# Patient Record
Sex: Female | Born: 2000 | Hispanic: Yes | Marital: Single | Smoking: Never smoker
Health system: Southern US, Community
[De-identification: ages and names within clinical notes are randomized; demographics above are authoritative.]

## PROBLEM LIST (undated history)

## (undated) DIAGNOSIS — B009 Herpesviral infection, unspecified: Secondary | ICD-10-CM

## (undated) HISTORY — DX: Herpesviral infection, unspecified: B00.9

---
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
## 2019-04-07 ENCOUNTER — Ambulatory Visit: Payer: Self-pay

## 2020-01-30 LAB — OB RESULTS CONSOLE HEPATITIS B SURFACE ANTIGEN: Hepatitis B Surface Ag: NEGATIVE

## 2020-01-30 LAB — OB RESULTS CONSOLE VARICELLA ZOSTER ANTIBODY, IGG: Varicella: IMMUNE

## 2020-01-30 LAB — OB RESULTS CONSOLE RUBELLA ANTIBODY, IGM: Rubella: IMMUNE

## 2020-02-02 ENCOUNTER — Other Ambulatory Visit: Payer: Self-pay | Admitting: Family Medicine

## 2020-02-02 ENCOUNTER — Other Ambulatory Visit (HOSPITAL_COMMUNITY): Payer: Self-pay | Admitting: Family Medicine

## 2020-02-02 DIAGNOSIS — Z3481 Encounter for supervision of other normal pregnancy, first trimester: Secondary | ICD-10-CM

## 2020-02-02 LAB — OB RESULTS CONSOLE GC/CHLAMYDIA
Chlamydia: NEGATIVE
Gonorrhea: NEGATIVE

## 2020-02-16 ENCOUNTER — Ambulatory Visit
Admission: RE | Admit: 2020-02-16 | Discharge: 2020-02-16 | Disposition: A | Discharge status: Home / Self Care | Payer: Medicaid Other | Source: Ambulatory Visit | Attending: Family Medicine | Admitting: Family Medicine

## 2020-02-16 ENCOUNTER — Other Ambulatory Visit: Payer: Self-pay | Admitting: Family Medicine

## 2020-02-16 ENCOUNTER — Other Ambulatory Visit: Payer: Self-pay

## 2020-02-16 DIAGNOSIS — Z3481 Encounter for supervision of other normal pregnancy, first trimester: Secondary | ICD-10-CM

## 2020-02-21 NOTE — L&D Delivery Note (Signed)
Delivery Note  Sarah Martinez is a [id number] at [redacted]w[redacted]d with an LMP of 11/14/2019, consistent with [place] at [redacted]w[redacted]d.   First Stage: Martinez onset: 2000 Augmentation: AROM Analgesia /Anesthesia intrapartum: Epidural AROM at 0753  Second Stage: Complete dilation at 0630 Onset of pushing at 1012 FHR second stage 145bpm with moderate variability, variables with pushign   Sarah Martinez.  She was expectantly mananged and progressed well to C/C/-1.  AROM performed to help with decent of fetal head.  Sarah Martinez did not have any urge to push.  Epidural rate was decreased and variety of positions were used to help with fetal descent during passive 2nd stage.  She reported an urge to push and pushed effectively over approximately 10 minutes for a spontaneous birth.   Delivery of a viable baby Sarah on 08/15/2020 at 10/22 by CNM Delivery of fetal head in LOA position with restitution to LOT. No nuchal cord;  Anterior then posterior shoulders delivered easily with gentle downward traction. Baby placed on mom's chest, and attended to by baby RN Cord double clamped after cessation of pulsation, cut by grandmother of baby   Cord blood sample collected: Yes - O pos   Third Stage: Oxytocin bolus started after delivery of infant for hemorrhage prophylaxis  Placenta delivered intact with 3 VC @ 1034 Placenta disposition: sent with patient  Uterine tone firm / bleeding initially brisk - > moderate to small after interventions   Brisk bleeding noted after delivery of placenta.  Uterus firm with massage.  Methergine 0.2 mg IM given in addition to oxytocin bolus.  Fundus remained firm, bleeding improved to moderate to small   1st vaginal laceration identified  Anesthesia for repair: epidural Repair with 2-0 vicryl CT in usual fashion  Est. Blood Loss (mL): 494 ml  Complications: None   Mom to postpartum.  Baby to Couplet care / Skin to Skin.  Newborn: Information for the patient's  newborn:  Sarah Martinez, Sarah Martinez [[id number]]  Live born female  Birth Weight: 8 lb 13.8 oz (4020 g) APGAR: 8, 9  Newborn Delivery   Birth date/time: 08/15/2020 10:22:00 Delivery type: Vaginal, Spontaneous     Feeding planned: breast   ---------- Margaretmary Eddy, CNM Certified Nurse Midwife [place]  [facility]

## 2020-03-02 ENCOUNTER — Other Ambulatory Visit: Payer: Self-pay | Admitting: Family Medicine

## 2020-03-02 DIAGNOSIS — Z3480 Encounter for supervision of other normal pregnancy, unspecified trimester: Secondary | ICD-10-CM

## 2020-03-22 ENCOUNTER — Ambulatory Visit
Admission: RE | Admit: 2020-03-22 | Discharge: 2020-03-22 | Disposition: A | Discharge status: Home / Self Care | Payer: Medicaid Other | Source: Ambulatory Visit | Attending: Family Medicine | Admitting: Family Medicine

## 2020-03-22 ENCOUNTER — Other Ambulatory Visit: Payer: Self-pay

## 2020-03-22 DIAGNOSIS — Z3480 Encounter for supervision of other normal pregnancy, unspecified trimester: Secondary | ICD-10-CM | POA: Insufficient documentation

## 2020-04-27 ENCOUNTER — Other Ambulatory Visit: Payer: Self-pay | Admitting: Family Medicine

## 2020-04-27 DIAGNOSIS — Z3480 Encounter for supervision of other normal pregnancy, unspecified trimester: Secondary | ICD-10-CM

## 2020-05-12 ENCOUNTER — Other Ambulatory Visit: Payer: Self-pay

## 2020-05-12 ENCOUNTER — Ambulatory Visit
Admission: RE | Admit: 2020-05-12 | Discharge: 2020-05-12 | Disposition: A | Discharge status: Home / Self Care | Payer: Medicaid Other | Source: Ambulatory Visit | Attending: Family Medicine | Admitting: Family Medicine

## 2020-05-12 DIAGNOSIS — Z3480 Encounter for supervision of other normal pregnancy, unspecified trimester: Secondary | ICD-10-CM | POA: Diagnosis present

## 2020-05-26 LAB — OB RESULTS CONSOLE RPR: RPR: NONREACTIVE

## 2020-05-26 LAB — OB RESULTS CONSOLE HIV ANTIBODY (ROUTINE TESTING): HIV: NONREACTIVE

## 2020-07-21 LAB — OB RESULTS CONSOLE GBS: GBS: NEGATIVE

## 2020-08-15 ENCOUNTER — Inpatient Hospital Stay
Admission: EM | Admit: 2020-08-15 | Discharge: 2020-08-16 | DRG: 807 | Disposition: A | Discharge status: Home / Self Care | Payer: Medicaid Other

## 2020-08-15 ENCOUNTER — Encounter: Payer: Self-pay | Admitting: Emergency Medicine

## 2020-08-15 ENCOUNTER — Inpatient Hospital Stay: Payer: Medicaid Other | Admitting: Anesthesiology

## 2020-08-15 ENCOUNTER — Other Ambulatory Visit: Payer: Self-pay

## 2020-08-15 DIAGNOSIS — O9832 Other infections with a predominantly sexual mode of transmission complicating childbirth: Principal | ICD-10-CM | POA: Diagnosis present

## 2020-08-15 DIAGNOSIS — O99214 Obesity complicating childbirth: Secondary | ICD-10-CM | POA: Diagnosis present

## 2020-08-15 DIAGNOSIS — Z20822 Contact with and (suspected) exposure to covid-19: Secondary | ICD-10-CM | POA: Diagnosis present

## 2020-08-15 DIAGNOSIS — A6000 Herpesviral infection of urogenital system, unspecified: Secondary | ICD-10-CM | POA: Diagnosis present

## 2020-08-15 DIAGNOSIS — Z3A39 39 weeks gestation of pregnancy: Secondary | ICD-10-CM

## 2020-08-15 DIAGNOSIS — O26893 Other specified pregnancy related conditions, third trimester: Secondary | ICD-10-CM | POA: Diagnosis present

## 2020-08-15 LAB — TYPE AND SCREEN
ABO/RH(D): O POS
Antibody Screen: NEGATIVE

## 2020-08-15 LAB — RPR: RPR Ser Ql: NONREACTIVE

## 2020-08-15 LAB — CBC
HCT: 36.8 % (ref 36.0–46.0)
Hemoglobin: 12.9 g/dL (ref 12.0–15.0)
MCH: 31.1 pg (ref 26.0–34.0)
MCHC: 35.1 g/dL (ref 30.0–36.0)
MCV: 88.7 fL (ref 80.0–100.0)
Platelets: 201 10*3/uL (ref 150–400)
RBC: 4.15 MIL/uL (ref 3.87–5.11)
RDW: 13.7 % (ref 11.5–15.5)
WBC: 8.8 10*3/uL (ref 4.0–10.5)
nRBC: 0 % (ref 0.0–0.2)

## 2020-08-15 LAB — RESP PANEL BY RT-PCR (FLU A&B, COVID) ARPGX2
Influenza A by PCR: NEGATIVE
Influenza B by PCR: NEGATIVE
SARS Coronavirus 2 by RT PCR: NEGATIVE

## 2020-08-15 MED ORDER — SODIUM CHLORIDE 0.9 % IV SOLN
INTRAVENOUS | Status: DC | PRN
  Administered 2020-08-15: 12 mL/h via EPIDURAL

## 2020-08-15 MED ORDER — DIPHENHYDRAMINE HCL 50 MG/ML IJ SOLN: 12.5000 mg | INTRAMUSCULAR | Status: DC | PRN

## 2020-08-15 MED ORDER — FENTANYL CITRATE (PF) 100 MCG/2ML IJ SOLN: 50.0000 ug | INTRAMUSCULAR | Status: DC | PRN

## 2020-08-15 MED ORDER — PRENATAL MULTIVITAMIN CH: 1.0000 | ORAL_TABLET | Freq: Every day | ORAL | Status: DC

## 2020-08-15 MED ORDER — ACETAMINOPHEN 500 MG PO TABS
1000.0000 mg | ORAL_TABLET | Freq: Four times a day (QID) | ORAL | Status: DC | PRN
  Administered 2020-08-15: 1000 mg via ORAL
  Filled 2020-08-15: qty 2

## 2020-08-15 MED ORDER — IBUPROFEN 600 MG PO TABS
600.0000 mg | ORAL_TABLET | Freq: Four times a day (QID) | ORAL | Status: DC
  Administered 2020-08-15 – 2020-08-16 (×3): 600 mg via ORAL
  Filled 2020-08-15 (×4): qty 1

## 2020-08-15 MED ORDER — PHENYLEPHRINE 40 MCG/ML (10ML) SYRINGE FOR IV PUSH (FOR BLOOD PRESSURE SUPPORT): 80.0000 ug | PREFILLED_SYRINGE | INTRAVENOUS | Status: DC | PRN

## 2020-08-15 MED ORDER — MISOPROSTOL 200 MCG PO TABS
ORAL_TABLET | ORAL | Status: AC
  Filled 2020-08-15: qty 4

## 2020-08-15 MED ORDER — OXYTOCIN-SODIUM CHLORIDE 30-0.9 UT/500ML-% IV SOLN
INTRAVENOUS | Status: AC
  Administered 2020-08-15: 333 mL via INTRAVENOUS
  Filled 2020-08-15: qty 500

## 2020-08-15 MED ORDER — BUPIVACAINE HCL (PF) 0.25 % IJ SOLN
INTRAMUSCULAR | Status: DC | PRN
  Administered 2020-08-15: 8 mL via EPIDURAL

## 2020-08-15 MED ORDER — ACETAMINOPHEN 500 MG PO TABS: 1000.0000 mg | ORAL_TABLET | Freq: Four times a day (QID) | ORAL | Status: DC | PRN

## 2020-08-15 MED ORDER — WITCH HAZEL-GLYCERIN EX PADS: 1.0000 "application " | MEDICATED_PAD | CUTANEOUS | Status: DC

## 2020-08-15 MED ORDER — COCONUT OIL OIL
1.0000 "application " | TOPICAL_OIL | Status: DC | PRN
  Administered 2020-08-16: 1 via TOPICAL
  Filled 2020-08-15 (×2): qty 120

## 2020-08-15 MED ORDER — LIDOCAINE HCL (PF) 1 % IJ SOLN
INTRAMUSCULAR | Status: DC | PRN
  Administered 2020-08-15: 3 mL

## 2020-08-15 MED ORDER — DOCUSATE SODIUM 100 MG PO CAPS
100.0000 mg | ORAL_CAPSULE | Freq: Two times a day (BID) | ORAL | Status: DC
  Administered 2020-08-15 – 2020-08-16 (×2): 100 mg via ORAL
  Filled 2020-08-15 (×2): qty 1

## 2020-08-15 MED ORDER — FENTANYL-BUPIVACAINE-NACL 0.5-0.125-0.9 MG/250ML-% EP SOLN: 12.0000 mL/h | EPIDURAL | Status: DC | PRN

## 2020-08-15 MED ORDER — VARICELLA VIRUS VACCINE LIVE 1350 PFU/0.5ML IJ SUSR
0.5000 mL | INTRAMUSCULAR | Status: DC | PRN
  Filled 2020-08-15: qty 0.5

## 2020-08-15 MED ORDER — BENZOCAINE-MENTHOL 20-0.5 % EX AERO: 1.0000 "application " | INHALATION_SPRAY | CUTANEOUS | Status: DC | PRN

## 2020-08-15 MED ORDER — OXYTOCIN-SODIUM CHLORIDE 30-0.9 UT/500ML-% IV SOLN
2.5000 [IU]/h | INTRAVENOUS | Status: DC
Start: 2020-08-15 — End: 2020-08-16

## 2020-08-15 MED ORDER — FENTANYL 2.5 MCG/ML W/ROPIVACAINE 0.15% IN NS 100 ML EPIDURAL (ARMC)
EPIDURAL | Status: AC
  Filled 2020-08-15: qty 100

## 2020-08-15 MED ORDER — DIPHENHYDRAMINE HCL 25 MG PO CAPS: 25.0000 mg | ORAL_CAPSULE | Freq: Four times a day (QID) | ORAL | Status: DC | PRN

## 2020-08-15 MED ORDER — LIDOCAINE-EPINEPHRINE (PF) 1.5 %-1:200000 IJ SOLN
INTRAMUSCULAR | Status: DC | PRN
  Administered 2020-08-15: 3 mL via PERINEURAL

## 2020-08-15 MED ORDER — LACTATED RINGERS IV SOLN: INTRAVENOUS | Status: DC

## 2020-08-15 MED ORDER — AMMONIA AROMATIC IN INHA
RESPIRATORY_TRACT | Status: AC
  Filled 2020-08-15: qty 10

## 2020-08-15 MED ORDER — OXYTOCIN BOLUS FROM INFUSION: 333.0000 mL | Freq: Once | INTRAVENOUS | Status: AC

## 2020-08-15 MED ORDER — METHYLERGONOVINE MALEATE 0.2 MG/ML IJ SOLN
INTRAMUSCULAR | Status: AC
  Administered 2020-08-15: 0.2 mg via INTRAMUSCULAR
  Filled 2020-08-15: qty 1

## 2020-08-15 MED ORDER — LACTATED RINGERS IV SOLN
500.0000 mL | Freq: Once | INTRAVENOUS | Status: AC
  Administered 2020-08-15: 500 mL via INTRAVENOUS

## 2020-08-15 MED ORDER — LACTATED RINGERS IV SOLN
500.0000 mL | INTRAVENOUS | Status: DC | PRN
  Administered 2020-08-15: 08:00:00 500 mL via INTRAVENOUS

## 2020-08-15 MED ORDER — SIMETHICONE 80 MG PO CHEW: 80.0000 mg | CHEWABLE_TABLET | ORAL | Status: DC | PRN

## 2020-08-15 MED ORDER — LIDOCAINE HCL (PF) 1 % IJ SOLN
INTRAMUSCULAR | Status: AC
  Filled 2020-08-15: qty 30

## 2020-08-15 MED ORDER — DIBUCAINE (PERIANAL) 1 % EX OINT: 1.0000 "application " | TOPICAL_OINTMENT | CUTANEOUS | Status: DC | PRN

## 2020-08-15 MED ORDER — ONDANSETRON HCL 4 MG/2ML IJ SOLN
4.0000 mg | INTRAMUSCULAR | Status: DC | PRN
  Administered 2020-08-15: 4 mg via INTRAVENOUS
  Filled 2020-08-15: qty 2

## 2020-08-15 MED ORDER — ONDANSETRON HCL 4 MG PO TABS
4.0000 mg | ORAL_TABLET | ORAL | Status: DC | PRN
  Filled 2020-08-15: qty 1

## 2020-08-15 MED ORDER — OXYTOCIN 10 UNIT/ML IJ SOLN
INTRAMUSCULAR | Status: AC
  Filled 2020-08-15: qty 2

## 2020-08-15 MED ORDER — SODIUM CHLORIDE 0.9 % IV SOLN: 2.0000 g | Freq: Once | INTRAVENOUS | Status: DC

## 2020-08-15 MED ORDER — EPHEDRINE 5 MG/ML INJ: 10.0000 mg | INTRAVENOUS | Status: DC | PRN

## 2020-08-15 MED ORDER — SOD CITRATE-CITRIC ACID 500-334 MG/5ML PO SOLN: 30.0000 mL | ORAL | Status: DC | PRN

## 2020-08-15 MED ORDER — ONDANSETRON HCL 4 MG/2ML IJ SOLN
4.0000 mg | Freq: Four times a day (QID) | INTRAMUSCULAR | Status: DC | PRN
  Administered 2020-08-15: 03:00:00 4 mg via INTRAVENOUS
  Filled 2020-08-15: qty 2

## 2020-08-15 MED ORDER — LIDOCAINE HCL (PF) 1 % IJ SOLN: 30.0000 mL | INTRAMUSCULAR | Status: DC | PRN

## 2020-08-15 MED ORDER — CALCIUM CARBONATE ANTACID 500 MG PO CHEW
2.0000 | CHEWABLE_TABLET | Freq: Four times a day (QID) | ORAL | Status: DC | PRN
  Administered 2020-08-15: 400 mg via ORAL
  Filled 2020-08-15: qty 2

## 2020-08-15 NOTE — Discharge Instructions (Addendum)
Discharge instructions:  Call office if you have any of the following: fever >101 F, chills, excessive vaginal bleeding, incision drainage or problems, leg pain or redness, or any other concerns.    Activity: Do not lift > 10 lbs for 8 weeks.  No intercourse or tampons for 8 weeks.  No driving for 1-2 weeks.    Please don't limit yourself in terms of routine activity.  You will be able to do most things, although they may take longer to do or be a little painful.  You can do it!   Don't be a hero, but don't be a wimp either!  Remember you should be getting better every day.  Please remember to walk around your home several times a day.    Vaginal Delivery, Care After Refer to this sheet in the next few weeks. These discharge instructions provide you with information on caring for yourself after delivery. Your caregiver may also give you specific instructions. Your treatment has been planned according to the most current medical practices available, but problems sometimes occur. Call your caregiver if you have any problems or questions after you go home. HOME CARE INSTRUCTIONS Take over-the-counter or prescription medicines only as directed by your caregiver or pharmacist. Do not drink alcohol, especially if you are breastfeeding or taking medicine to relieve pain. Do not smoke tobacco. Continue to use good perineal care. Good perineal care includes: Wiping your perineum from back to front Keeping your perineum clean. You can do sitz baths twice a day, to help keep this area clean Do not use tampons, douche or have sex until your caregiver says it is okay. Shower only and avoid sitting in submerged water, aside from sitz baths Wear a well-fitting bra that provides breast support. Eat healthy foods. Drink enough fluids to keep your urine clear or pale yellow. Eat high-fiber foods such as whole grain cereals and breads, brown rice, beans, and fresh fruits and vegetables every day. These foods  may help prevent or relieve constipation. Avoid constipation with high fiber foods or medications, such as miralax or metamucil Follow your caregiver's recommendations regarding resumption of activities such as climbing stairs, driving, lifting, exercising, or traveling. Talk to your caregiver about resuming sexual activities. Resumption of sexual activities is dependent upon your risk of infection, your rate of healing, and your comfort and desire to resume sexual activity. Try to have someone help you with your household activities and your newborn for at least a few days after you leave the hospital. Rest as much as possible. Try to rest or take a nap when your newborn is sleeping. Increase your activities gradually. Keep all of your scheduled postpartum appointments. It is very important to keep your scheduled follow-up appointments. At these appointments, your caregiver will be checking to make sure that you are healing physically and emotionally. SEEK MEDICAL CARE IF:  You are passing large clots from your vagina. Save any clots to show your caregiver. You have a foul smelling discharge from your vagina. You have trouble urinating. You are urinating frequently. You have pain when you urinate. You have a change in your bowel movements. You have increasing redness, pain, or swelling near your vaginal incision (episiotomy) or vaginal tear. You have pus draining from your episiotomy or vaginal tear. Your episiotomy or vaginal tear is separating. You have painful, hard, or reddened breasts. You have a severe headache. You have blurred vision or see spots. You feel sad or depressed. You have thoughts of hurting yourself  or your newborn. You have questions about your care, the care of your newborn, or medicines. You are dizzy or light-headed. You have a rash. You have nausea or vomiting. You were breastfeeding and have not had a menstrual period within 12 weeks after you stopped  breastfeeding. You are not breastfeeding and have not had a menstrual period by the 12th week after delivery. You have a fever. SEEK IMMEDIATE MEDICAL CARE IF:  You have persistent pain. You have chest pain. You have shortness of breath. You faint. You have leg pain. You have stomach pain. Your vaginal bleeding saturates two or more sanitary pads in 1 hour. MAKE SURE YOU:  Understand these instructions. Will watch your condition. Will get help right away if you are not doing well or get worse. Document Released: 02/04/2000 Document Revised: 06/23/2013 Document Reviewed: 10/04/2011 [facility] Patient Information 2015 [place], [place]. This information is not intended to replace advice given to you by your health care provider. Make sure you discuss any questions you have with your health care provider.  Sitz Bath A sitz bath is a warm water bath taken in the sitting position. The water covers only the hips and butt (buttocks). We recommend using one that fits in the toilet, to help with ease of use and cleanliness. It may be used for either healing or cleaning purposes. Sitz baths are also used to relieve pain, itching, or muscle tightening (spasms). The water may contain medicine. Moist heat will help you heal and relax.  HOME CARE  Take 3 to 4 sitz baths a day. Fill the bathtub half-full with warm water. Sit in the water and open the drain a little. Turn on the warm water to keep the tub half-full. Keep the water running constantly. Soak in the water for 15 to 20 minutes. After the sitz bath, pat the affected area dry. GET HELP RIGHT AWAY IF: You get worse instead of better. Stop the sitz baths if you get worse. MAKE SURE YOU: Understand these instructions. Will watch your condition. Will get help right away if you are not doing well or get worse. Document Released: 03/16/2004 Document Revised: 11/01/2011 Document Reviewed: 06/06/2010 [facility] Patient Information 2015 [place], [place].  This information is not intended to replace advice given to you by your health care provider. Make sure you discuss any questions you have with your health care provider.

## 2020-08-15 NOTE — Anesthesia Preprocedure Evaluation (Signed)
Anesthesia Evaluation  Patient identified by MRN, date of birth, ID band Patient awake    Reviewed: Allergy & Precautions, NPO status , Patient's Chart, lab work & pertinent test results  Airway Mallampati: II  TM Distance: >3 FB Neck ROM: Full    Dental no notable dental hx.    Pulmonary neg pulmonary ROS,    Pulmonary exam normal        Cardiovascular negative cardio ROS   Rhythm:Regular Rate:Normal     Neuro/Psych negative neurological ROS  negative psych ROS   GI/Hepatic negative GI ROS, Neg liver ROS,   Endo/Other  negative endocrine ROS  Renal/GU negative Renal ROS  negative genitourinary   Musculoskeletal negative musculoskeletal ROS (+)   Abdominal   Peds negative pediatric ROS (+)  Hematology negative hematology ROS (+)   Anesthesia Other Findings   Reproductive/Obstetrics negative OB ROS (+) Pregnancy                             Anesthesia Physical Anesthesia Plan  ASA: 2  Anesthesia Plan: Epidural   Post-op Pain Management:    Induction:   PONV Risk Score and Plan:   Airway Management Planned:   Additional Equipment:   Intra-op Plan:   Post-operative Plan:   Informed Consent: I have reviewed the patients History and Physical, chart, labs and discussed the procedure including the risks, benefits and alternatives for the proposed anesthesia with the patient or authorized representative who has indicated his/her understanding and acceptance.       Plan Discussed with:   Anesthesia Plan Comments:         Anesthesia Quick Evaluation

## 2020-08-15 NOTE — OB Triage Note (Signed)
Pt is a G3 P1 pt with [facility] and arrives here with c/o ctx's every 2-5. No bleeding or fluid loss at this time.

## 2020-08-15 NOTE — Discharge Summary (Addendum)
Obstetrical Discharge Summary  Patient Name: Sarah Martinez DOB: 28-Mar-2000 MRN: [id number]  Date of Admission: 08/15/2020 Date of Delivery: 08/15/2020 Delivered by: Margaretmary Eddy, CNM  Date of Discharge: 08/16/2020  Primary OB: [facility] FAO:[id number] last menstrual period was 11/14/2019. EDC Estimated Date of Delivery: 08/20/20 Gestational Age at Delivery: [redacted]w[redacted]d   Antepartum complications:  Varicella non-immune Hx of HSV - Taking Acyclovir Obesity in pregnancy   Admitting Diagnosis: active labor  Secondary Diagnosis: Patient Active Problem List   Diagnosis Date Noted   NSVD (normal spontaneous vaginal delivery) 08/16/2020    Augmentation: AROM Complications: None Intrapartum complications/course: Sarah Martinez presented to L&D in active labor.  She was expectantly mananged and progressed well to C/C/-1.  AROM performed to help with decent of fetal head.  Sarah Martinez did not have any urge to push.  Epidural rate was decreased and variety of positions were used to help with fetal descent during passive 2nd stage.  She reported an urge to push and pushed effectively over approximately 10 minutes for a spontaneous birth.   Delivery Type: spontaneous vaginal delivery Anesthesia: epidural Placenta: spontaneous Laceration: 1st vaginal with repair  Episiotomy: none Newborn Data: Live born female  Birth Weight: 8 lb 13.8 oz (4020 g) APGAR: 8, 9  Newborn Delivery   Birth date/time: 08/15/2020 10:22:00 Delivery type: Vaginal, Spontaneous        Postpartum Procedures: None Edinburgh:  Edinburgh Postnatal Depression Scale Screening Tool 08/16/2020 08/15/2020  I have been able to laugh and see the funny side of things. 0 (No Data)  I have looked forward with enjoyment to things. 0 -  I have blamed myself unnecessarily when things went wrong. 1 -  I have been anxious or worried for no good reason. 0 -  I have felt scared or panicky for no good reason. 0 -  Things have been getting on top  of me. 1 -  I have been so unhappy that I have had difficulty sleeping. 0 -  I have felt sad or miserable. 1 -  I have been so unhappy that I have been crying. 0 -  The thought of harming myself has occurred to me. 0 -  Edinburgh Postnatal Depression Scale Total 3 -     Post partum course:  Patient had an uncomplicated postpartum course.  By time of discharge on PPD#1, her pain was controlled on oral pain medications; she had appropriate lochia and was ambulating, voiding without difficulty and tolerating regular diet.  She was deemed stable for discharge to home.    Discharge Physical Exam:  BP 101/70 (BP Location: Right Arm)   Pulse 78   Temp 98.2 F (36.8 C) (Oral)   Resp 18   Ht 5\' 1"  (1.549 m)   Wt 81.6 kg   LMP 11/14/2019   SpO2 98%   Breastfeeding Unknown   BMI 34.01 kg/m   General: NAD CV: RRR Pulm: CTABL, nl effort ABD: s/nd/nt, fundus firm and below the umbilicus Lochia: moderate Perineum:minimal edema/repair well approximated DVT Evaluation: LE non-ttp, no evidence of DVT on exam.  Hemoglobin  Date Value Ref Range Status  08/16/2020 10.2 (L) 12.0 - 15.0 g/dL Final   HGB  Date Value Ref Range Status  09/07/2013 13.2 12.0 - 16.0 g/dL Final   HCT  Date Value Ref Range Status  08/16/2020 28.9 (L) 36.0 - 46.0 % Final  09/07/2013 39.4 35.0 - 47.0 % Final     Disposition: stable, discharge to home. Baby Feeding: breastmilk Baby  Disposition: home with mom  Rh Immune globulin given: Rh pos  Rubella vaccine given: Immune Varivax vaccine given: Non-Immune - Varivax vaccine refused Flu vaccine given in AP or PP setting: declined  Tdap vaccine given in AP or PP setting: given 06/21/2020  Contraception: TBD  Prenatal Labs:  Blood type/Rh O pos   Antibody screen neg  Rubella Immune  Varicella Non-Immune  RPR NR  HBsAg Neg  HIV NR  GC neg  Chlamydia neg  Genetic screening Declined   1 hour GTT 84  3 hour GTT N/A  GBS Negative     Plan:  Sarah Martinez was discharged to home in good condition. Follow-up appointment with delivering provider in 6 weeks.  Discharge Medications: Allergies as of 08/16/2020   No Known Allergies      Medication List     TAKE these medications    famotidine 20 MG tablet Commonly known as: Pepcid Take 1 tablet (20 mg total) by mouth 2 (two) times daily.   prenatal multivitamin Tabs tablet Take 1 tablet by mouth daily at 12 noon.         Follow-up Information     Center, [facility]. Schedule an appointment as soon as possible for a visit in 6 week(s).   Specialty: General Practice Why: postpartum visit Contact information: [address] [place] [address] [phone]                 Signed: Cyril Mourning 08/16/2020 12:30 PM

## 2020-08-15 NOTE — H&P (Signed)
OB History & Physical   History of Present Illness:   Chief Complaint: regular, painful contractions   HPI:  Ravynn Hogate Lynnea Vandervoort is a 20 y.o. G53P0101 female at [redacted]w[redacted]d dated by LMP of 11/14/2019, c/w [place] at [redacted]w[redacted]d.  She presents to L&D for regular, painful contractions.   Reports active fetal movement  Contractions: every 2 to 3 minutes, started at 2000 last night  LOF/SROM: denies  Vaginal bleeding: denies   Pregnancy Issues: Varicella non-immune Hx of HSV - Taking Acyclovir  Obesity in pregnancy   Patient Active Problem List   Diagnosis Date Noted   Normal labor 08/15/2020     Maternal Medical History:  History reviewed. No pertinent past medical history.  History reviewed. No pertinent surgical history.  No Known Allergies  Prior to Admission medications   Medication Sig Start Date End Date Taking? Authorizing Provider  famotidine (PEPCID) 20 MG tablet Take 1 tablet (20 mg total) by mouth 2 (two) times daily. 06/14/15   Emily Filbert, MD  ondansetron (ZOFRAN) 4 MG tablet Take 1 tablet (4 mg total) by mouth daily as needed for nausea or vomiting. 06/14/15   Emily Filbert, MD  Prenatal Vit-Fe Fumarate-FA (PRENATAL MULTIVITAMIN) TABS tablet Take 1 tablet by mouth daily at 12 noon.    [provider]     [facility]  Social History: She  reports that she has never smoked. She has never used smokeless tobacco. She reports that she does not drink alcohol and does not use drugs.  Family History: family history is not on file.   Review of Systems: A full review of systems was performed and negative except as noted in the HPI.     Physical Exam:  Vital Signs: BP 105/67   Pulse 89   Temp 98.4 F (36.9 C) (Oral)   Resp 14   Ht 5\' 1"  (1.549 m)   Wt 81.6 kg   LMP 11/14/2019   SpO2 99%   BMI 34.01 kg/m    General: no acute distress.  HEENT: normocephalic, atraumatic Heart: regular rate & rhythm.  No  murmurs/rubs/gallops Lungs: clear to auscultation bilaterally, normal respiratory effort Abdomen: soft, gravid, non-tender;  EFW: 7 1/2 lbs  Pelvic:   External: Normal external female genitalia  Cervix: 6/90/-2   Extremities: non-tender, symmetric, No edema bilaterally.  DTRs: 2+/2+  Neurologic: Alert & oriented x 3.    Results for orders placed or performed during the hospital encounter of 08/15/20 (from the past 24 hour(s))  CBC     Status: None   Collection Time: 08/15/20  2:02 AM  Result Value Ref Range   WBC 8.8 4.0 - 10.5 K/uL   RBC 4.15 3.87 - 5.11 MIL/uL   Hemoglobin 12.9 12.0 - 15.0 g/dL   HCT 08/17/20 17.6 - 16.0 %   MCV 88.7 80.0 - 100.0 fL   MCH 31.1 26.0 - 34.0 pg   MCHC 35.1 30.0 - 36.0 g/dL   RDW 73.7 10.6 - 26.9 %   Platelets 201 150 - 400 K/uL   nRBC 0.0 0.0 - 0.2 %  Resp Panel by RT-PCR (Flu A&B, Covid) Nasopharyngeal Swab     Status: None   Collection Time: 08/15/20  2:02 AM   Specimen: Nasopharyngeal Swab; Nasopharyngeal(NP) swabs in vial transport medium  Result Value Ref Range   SARS Coronavirus 2 by RT PCR NEGATIVE NEGATIVE   Influenza A by PCR NEGATIVE NEGATIVE   Influenza B by PCR NEGATIVE  NEGATIVE  Type and screen     Status: None   Collection Time: 08/15/20  4:18 AM  Result Value Ref Range   ABO/RH(D) O POS    Antibody Screen NEG    Sample Expiration      08/18/2020,2359 Performed at [facility], [address] [place], [place] [address]     Pertinent Results:  Prenatal Labs: Blood type/Rh O pos   Antibody screen neg  Rubella Immune  Varicella Non-Immune  RPR NR  HBsAg Neg  HIV NR  GC neg  Chlamydia neg  Genetic screening Declined   1 hour GTT 84  3 hour GTT N/A  GBS Negative    FHT:  FHR: 130 bpm, variability: moderate,  accelerations:  Present,  decelerations:  Absent Category/reactivity:  Category I UC:   regular, every 2-3 minutes   Cephalic by Leop[place] and SVE   No results found.  Assessment:  Liddie Chichester is a 20 y.o. G39P0101 female at [redacted]w[redacted]d with normal labor.   Plan:  1. Admit to Labor & Delivery; consents reviewed and obtained - Covid admission screen  - History of HSV - no lesions seen during pelvic exam   2. Fetal Well being  - Fetal Tracing: Cat 1 - Group B Streptococcus ppx not indicated: GBS neg - Presentation: cephalic confirmed by SVE   3. Routine OB: - Prenatal labs reviewed, as above - Rh pos - CBC, T&S, RPR on admit - Clear fluids, IVF  4. Monitoring of labor  - Contractions monitored with external toco - Pelvis proven to 6lbs 15oz, adequate for trial of labor  - Plan for expectant management  - Augmentation with AROM as appropriate  - Plan for  continuous fetal monitoring - Maternal pain control as desired; planning regional anesthesia - Anticipate vaginal delivery  5. Post Partum Planning: - Infant feeding: Breast - Contraception: TBD - Tdap vaccine: given 06/21/2020 - Flu vaccine: declined    Margaretmary Eddy, CNM Certified Nurse Midwife [facility]

## 2020-08-15 NOTE — Anesthesia Procedure Notes (Signed)
Epidural Patient location during procedure: OB Start time: 08/15/2020 2:50 AM End time: 08/15/2020 3:06 AM  Staffing Anesthesiologist: Sheila Oats, MD Performed: anesthesiologist   Preanesthetic Checklist Completed: patient identified, IV checked, site marked, risks and benefits discussed, surgical consent, monitors and equipment checked, pre-op evaluation and timeout performed  Epidural Patient position: sitting Prep: ChloraPrep Patient monitoring: heart rate, continuous pulse ox and blood pressure Approach: midline Location: L3-L4 Injection technique: LOR saline  Needle:  Needle type: Tuohy  Needle gauge: 17 G Needle length: 9 cm and 9 Needle insertion depth: 7 cm Catheter type: closed end flexible Catheter size: 19 Gauge Catheter at skin depth: 12 cm Test dose: negative and 1.5% lidocaine with Epi 1:200 K  Assessment Sensory level: T10 Events: blood not aspirated, injection not painful, no injection resistance, no paresthesia and negative IV test  Additional Notes 1 attempt Pt. Evaluated and documentation done after procedure finished. Patient identified. Risks/Benefits/Options discussed with patient including but not limited to bleeding, infection, nerve damage, paralysis, failed block, incomplete pain control, headache, blood pressure changes, nausea, vomiting, reactions to medication both or allergic, itching and postpartum back pain. Confirmed with bedside nurse the patient's most recent platelet count. Confirmed with patient that they are not currently taking any anticoagulation, have any bleeding history or any family history of bleeding disorders. Patient expressed understanding and wished to proceed. All questions were answered. Sterile technique was used throughout the entire procedure. Please see nursing notes for vital signs. Test dose was given through epidural catheter and negative prior to continuing to dose epidural or start infusion. Warning signs of high  block given to the patient including shortness of breath, tingling/numbness in hands, complete motor block, or any concerning symptoms with instructions to call for help. Patient was given instructions on fall risk and not to get out of bed. All questions and concerns addressed with instructions to call with any issues or inadequate analgesia.    Patient tolerated the insertion well without immediate complications.Reason for block:procedure for pain

## 2020-08-16 LAB — CBC
HCT: 28.9 % — ABNORMAL LOW (ref 36.0–46.0)
Hemoglobin: 10.2 g/dL — ABNORMAL LOW (ref 12.0–15.0)
MCH: 31.1 pg (ref 26.0–34.0)
MCHC: 35.3 g/dL (ref 30.0–36.0)
MCV: 88.1 fL (ref 80.0–100.0)
Platelets: 152 10*3/uL (ref 150–400)
RBC: 3.28 MIL/uL — ABNORMAL LOW (ref 3.87–5.11)
RDW: 13.6 % (ref 11.5–15.5)
WBC: 11 10*3/uL — ABNORMAL HIGH (ref 4.0–10.5)
nRBC: 0 % (ref 0.0–0.2)

## 2020-08-16 NOTE — Anesthesia Postprocedure Evaluation (Signed)
Anesthesia Post Note  Patient: Sarah Martinez  Procedure(s) Performed: AN AD HOC LABOR EPIDURAL  Patient location during evaluation: Mother Baby Anesthesia Type: Epidural Level of consciousness: awake and alert Pain management: pain level controlled Vital Signs Assessment: post-procedure vital signs reviewed and stable Respiratory status: spontaneous breathing, nonlabored ventilation and respiratory function stable Cardiovascular status: stable Postop Assessment: no headache, no backache and epidural receding Anesthetic complications: no   No notable events documented.   Last Vitals:  Vitals:   08/15/20 2341 08/16/20 0324  BP: 109/74 107/77  Pulse: 90 81  Resp: 20 20  Temp: 36.8 C 37 C  SpO2: 99%     Last Pain:  Vitals:   08/16/20 0346  TempSrc:   PainSc: 5                  Hillard Goodwine

## 2020-08-16 NOTE — Plan of Care (Signed)
D/C order from MD.  Reviewed d/c instructions and prescriptions with patient and answered any questions.  Patient d/c home with infant via wheelchair by nursing/auxillary. 

## 2020-08-16 NOTE — Progress Notes (Signed)
Post Partum Day 1  Subjective: Doing well, no concerns. Ambulating without difficulty, pain managed with PO meds, tolerating regular diet, and voiding without difficulty.   No fever/chills, chest pain, shortness of breath, nausea/vomiting, or leg pain. No nipple or breast pain.   Objective: BP 101/70 (BP Location: Right Arm)   Pulse 78   Temp 98.2 F (36.8 C) (Oral)   Resp 18   Ht 5\' 1"  (1.549 m)   Wt 81.6 kg   LMP 11/14/2019   SpO2 98%   Breastfeeding Unknown   BMI 34.01 kg/m    Physical Exam:  General: alert and cooperative Breasts: soft/nontender CV: RRR Pulm: nl effort Abdomen: soft, non-tender, active bowel sounds Uterine Fundus: firm Incision: n/a Perineum: minimal edema, repair well approximated Lochia: appropriate DVT Evaluation: No evidence of DVT seen on physical exam. Edinburgh:  Edinburgh Postnatal Depression Scale Screening Tool 08/15/2020  I have been able to laugh and see the funny side of things. (No Data)     Recent Labs    08/15/20 0202 08/16/20 0555  HGB 12.9 10.2*  HCT 36.8 28.9*  WBC 8.8 11.0*  PLT 201 152    Assessment/Plan: 20 y.o. 08/18/20 postpartum day # 1  -Continue routine postpartum care -Lactation consult PRN for breastfeeding   -Immunization status: Needs varicella prior to discharge/ all immunizations up to date  Disposition: Continue inpatient postpartum care. Desires discharge home today   LOS: 1 day   Jester Klingberg, CNM 08/16/2020, 8:18 AM

## 2022-02-20 NOTE — L&D Delivery Note (Signed)
Delivery Note   Sarah Martinez Sarah Martinez is a 22 y.o. [id number] at [redacted]w[redacted]d Estimated Date of Delivery: 02/09/23  PRE-OPERATIVE DIAGNOSIS:  1) [redacted]w[redacted]d pregnancy.  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     SPECIMENS:   PLACENTA:   Appearance: Intact   Removal: Spontaneous     Disposition:  home with family  CORD BLOOD: to lab for typing  DISPOSITION:  Infant left in stable condition in the delivery room, with L&D personnel and mother,  NARRATIVE SUMMARY: Labor course:  [name] is a [id number] at 103w4d who presented to Labor & Delivery for labor management. Her initial cervical exam was unable to be determined due to patient discomfort, per RN was active. Patient called out with contraction and upon my arrival to room head & shoulders of infant had delivered spontaneously, body then delivered with ease with maternal effort & viable female infant was placed on maternal abdomen at 0149. Patient desired delayed cord clamping, placenta delivered prior to disconnection. There was not a nuchal cord.  The placenta delivered spontaneously and was noted to be intact with a 3VC. A perineal and vaginal examination was performed. Episiotomy/Lacerations:  None The patient tolerated this well. Mother and baby were left in stable condition.   Dominica Severin, CNM 02/06/2023 3:07 AM

## 2022-08-05 IMAGING — US US OB COMP +14 WK
1 series · 13 of 28 positions shown · non-contrast
Comparison: none

CLINICAL DATA: Second trimester pregnancy for fetal anatomy survey.

EXAM:
OBSTETRICAL ULTRASOUND >14 WKS

[Series 1: us ob comp +14 wk · 0.22mm/px · 13 of 94 slices shown]
[im 4/94]
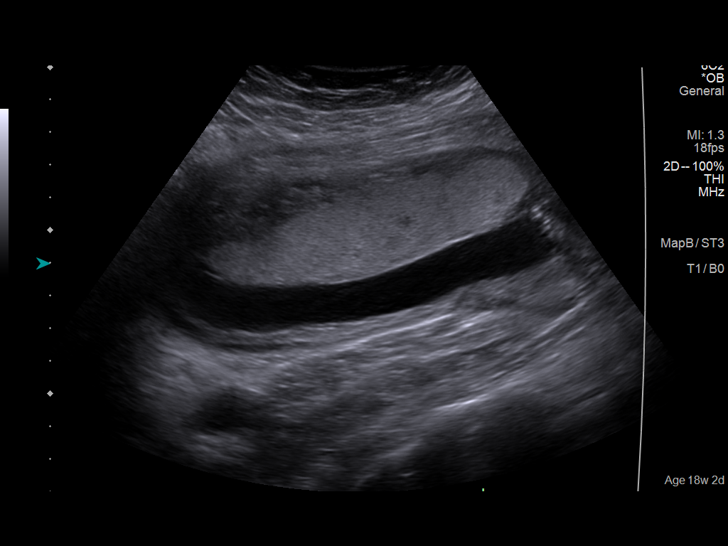
[im 11/94]
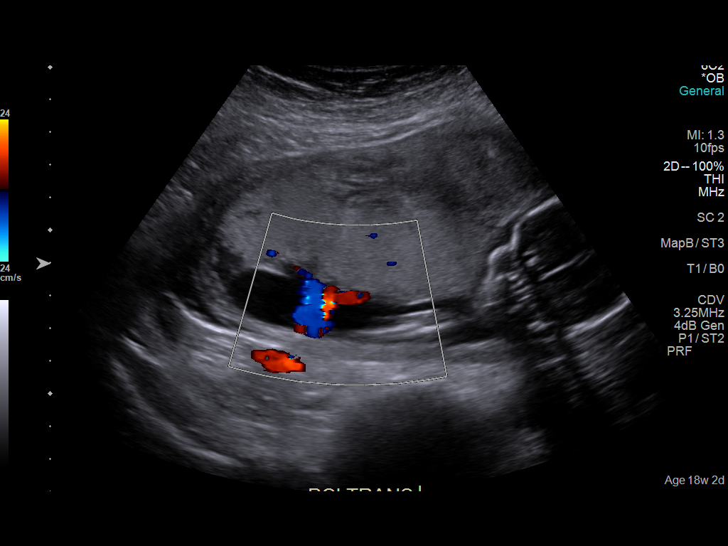
[im 18/94]
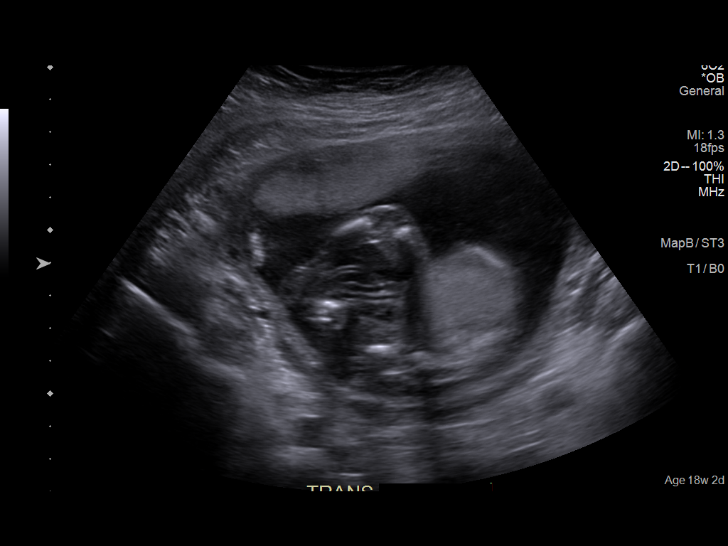
[im 25/94]
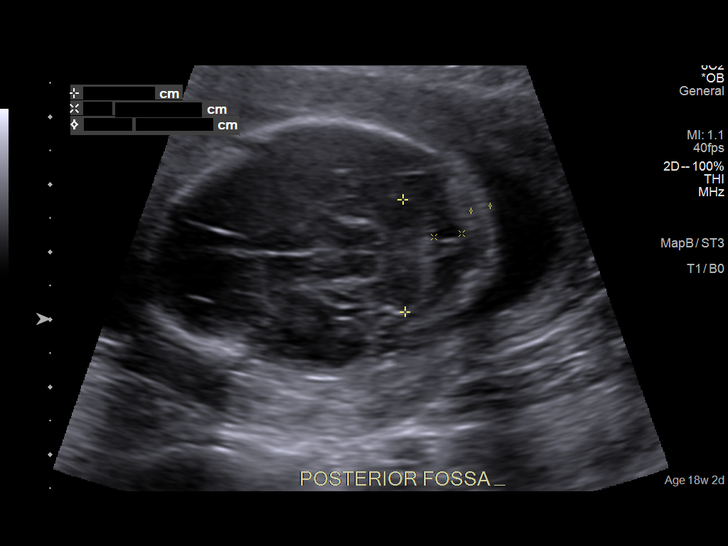
[im 32/94]
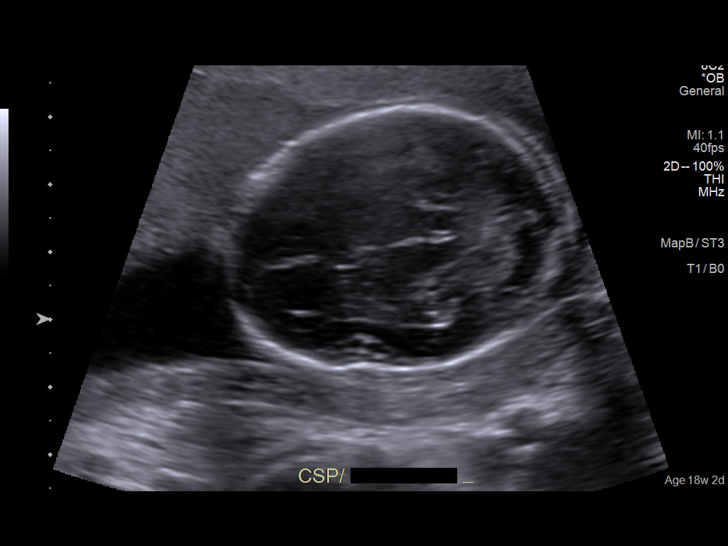
[im 38/94]
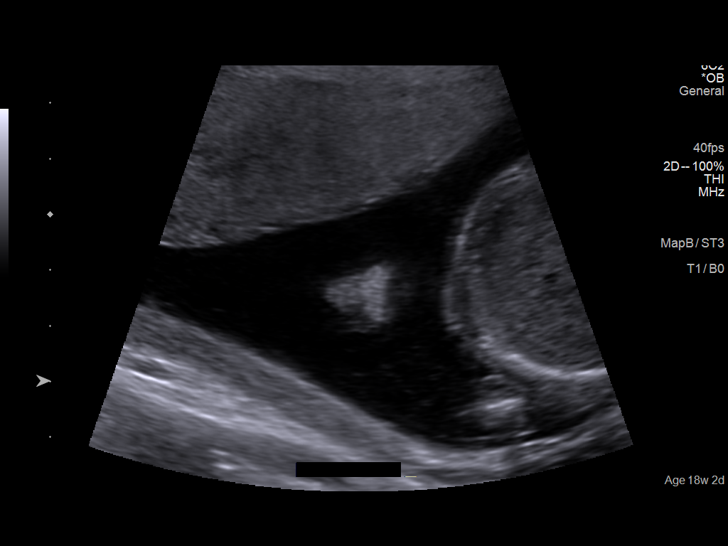
[im 49/94]
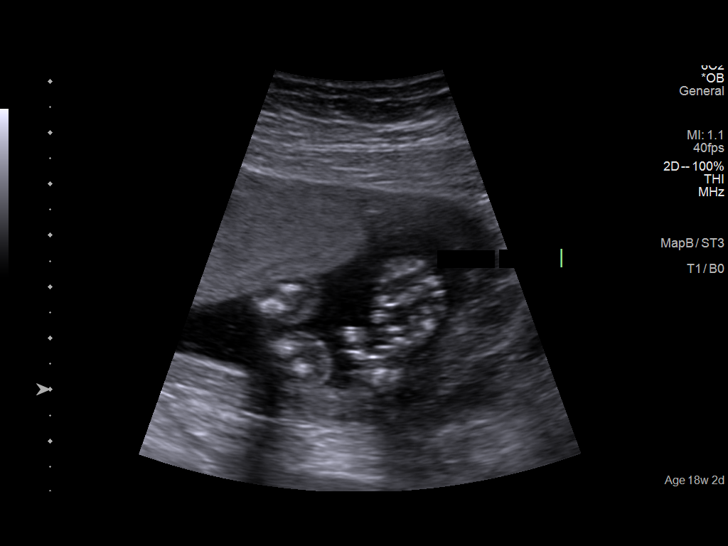
[im 56/94]
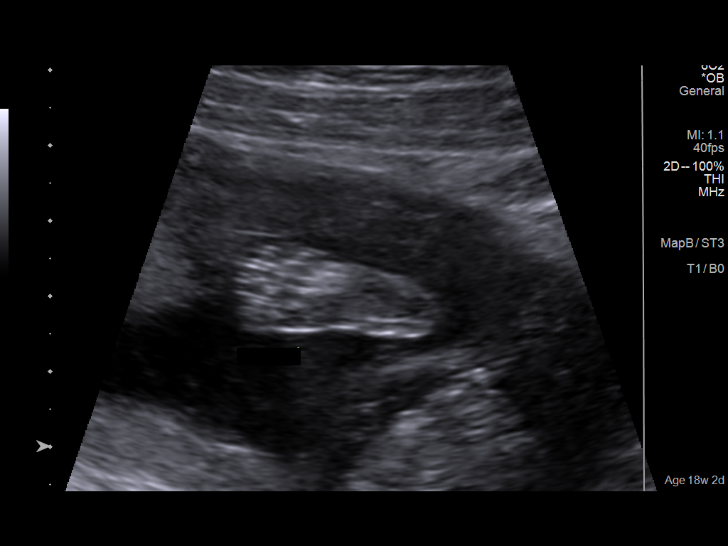
[im 63/94]
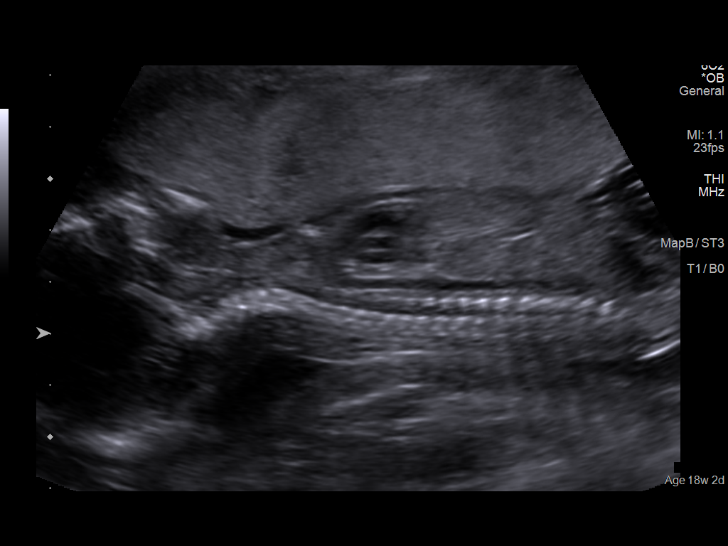
[im 69/94]
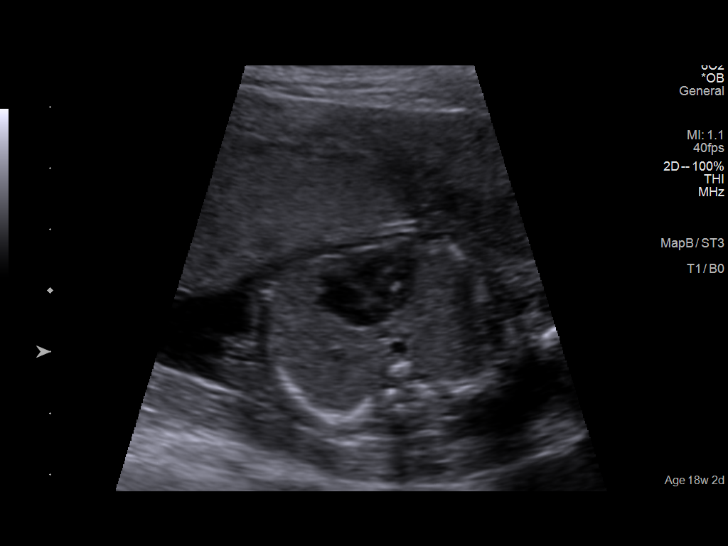
[im 76/94]
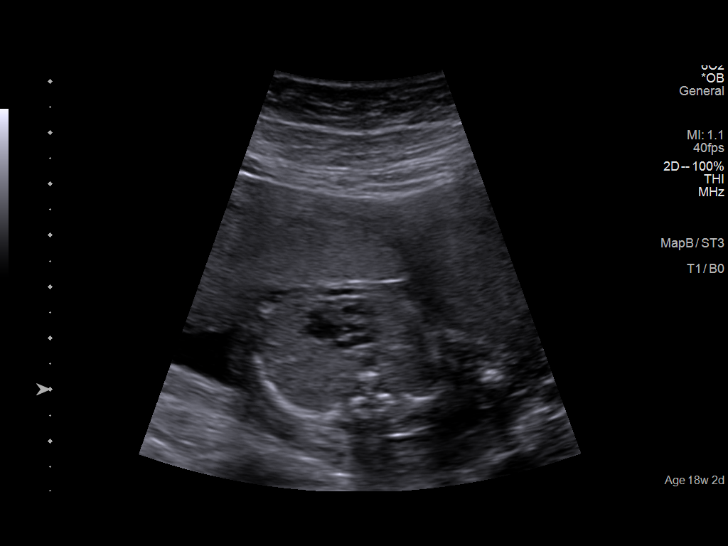
[im 83/94]
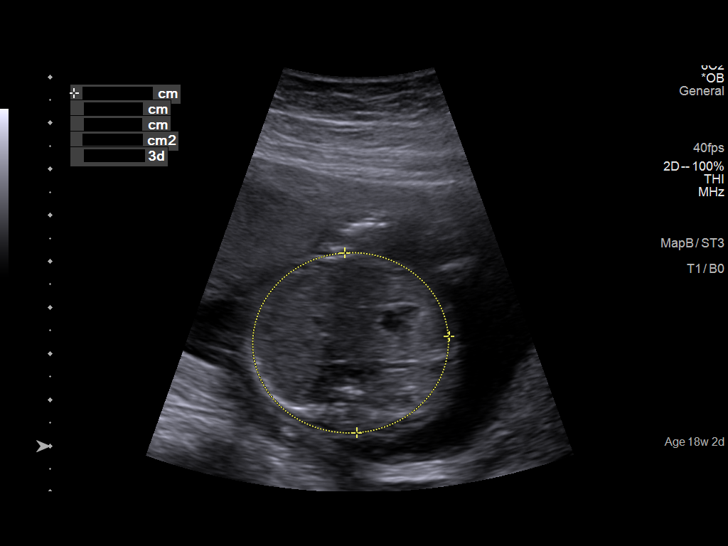
[im 90/94]
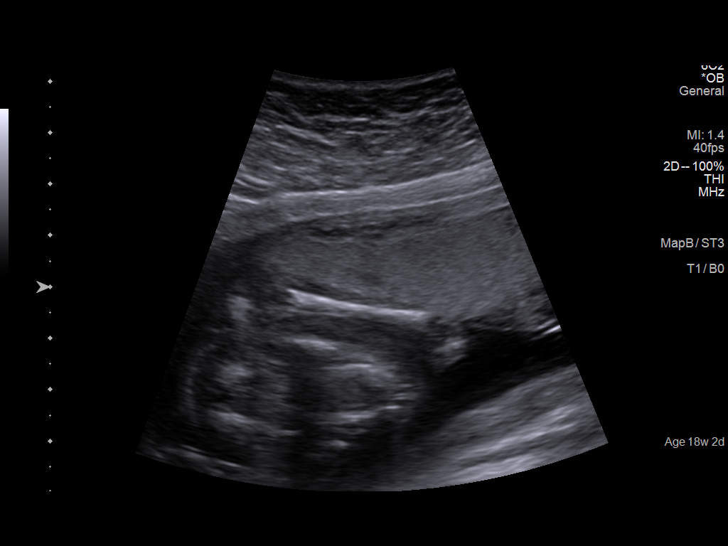

[13 of 28 positions shown; findings below may reference images not displayed]

FINDINGS: Number of Fetuses: 1

Heart Rate:  152 bpm

Movement: Yes

Presentation: Breech

Previa: No

Placental Location: Anterior

Amniotic Fluid (Subjective): Within normal limits

Amniotic Fluid (Objective):

Vertical pocket = 3.2cm

FETAL BIOMETRY

BPD: 3.9cm 17w 5d

HC:   14.5cm 17w 5d

AC:   13.1cm 18w 4d

FL:   2.6cm 18w 0d

Current Mean GA: 18w 1d US EDC: 08/22/2020

Assigned GA:  18w 2d Assigned EDC: 08/21/2020

FETAL ANATOMY

Lateral Ventricles: Appears normal

Thalami/CSP: Appears normal

Posterior Fossa:  Appears normal

Nuchal Region: Appears normal   NFT= 2.9 mm

Upper Lip: Appears normal

Spine: Not visualized

4 Chamber Heart on Left: Appears normal

LVOT: Appears normal

RVOT: Appears normal

Stomach on Left: Appears normal

3 Vessel Cord: Appears normal

Cord Insertion site: Appears normal

Kidneys: Appears normal

Bladder: Appears normal

Extremities: Appears normal

Technically difficult due to: Fetal position

Maternal Findings:

Cervix:  4.1 cm TA
IMPRESSION: Assigned GA currently 18 weeks 2 days.  Appropriate fetal growth.

No fetal anomalies identified, however, fetal spine could not be
visualized due to fetal position. Followup ultrasound could be
obtained in 3-4 weeks if desired.

## 2022-09-01 ENCOUNTER — Encounter: Payer: Medicaid Other | Admitting: Advanced Practice Midwife

## 2022-09-06 ENCOUNTER — Ambulatory Visit (INDEPENDENT_AMBULATORY_CARE_PROVIDER_SITE_OTHER): Payer: Medicaid Other | Admitting: Certified Nurse Midwife

## 2022-09-06 ENCOUNTER — Encounter: Payer: Self-pay | Admitting: Certified Nurse Midwife

## 2022-09-06 VITALS — BP 113/71 | HR 89 | Ht 61.0 in | Wt 167.3 lb

## 2022-09-06 DIAGNOSIS — N912 Amenorrhea, unspecified: Secondary | ICD-10-CM | POA: Diagnosis not present

## 2022-09-06 DIAGNOSIS — Z3480 Encounter for supervision of other normal pregnancy, unspecified trimester: Secondary | ICD-10-CM

## 2022-09-06 DIAGNOSIS — Z3202 Encounter for pregnancy test, result negative: Secondary | ICD-10-CM

## 2022-09-06 LAB — POCT URINE PREGNANCY: Preg Test, Ur: POSITIVE — AB

## 2022-09-06 MED ORDER — DICLEGIS 10-10 MG PO TBEC: 1.0000 | DELAYED_RELEASE_TABLET | Freq: Every day | ORAL | 3 refills | Status: DC

## 2022-09-06 NOTE — Progress Notes (Signed)
Subjective:    Sarah Martinez is a 22 y.o. female who presents for evaluation of amenorrhea. She believes she could be pregnant. Pregnancy is desired. Sexual Activity: single partner, contraception: none. Current symptoms also include: breast tenderness, fatigue, and nausea. Last period was abnormal. Pt has irregular periods not sure of her LPM   No LMP recorded (lmp unknown). The following portions of the patient's history were reviewed and updated as appropriate: allergies, current medications, past family history, past medical history, past social history, past surgical history, and problem list.  Review of Systems Pertinent items are noted in HPI.     Objective:    BP 113/71   Pulse 89   Ht 5\' 1"  (1.549 m) Comment: patient reports  Wt 167 lb 4.8 oz (75.9 kg)   LMP  (LMP Unknown)   BMI 31.61 kg/m  General: alert, cooperative, appears stated age, and no acute distress    Lab Review Urine HCG: positive    Assessment:    Absence of menstruation.     Plan:    Pregnancy Test:  Positive: EDC: unknown. Briefly discussed pre-natal care options. . Encouraged well-balanced diet, plenty of rest when needed, pre-natal vitamins daily and walking for exercise. Discussed self-help for nausea, avoiding OTC medications until consulting provider or pharmacist, other than Tylenol as needed, minimal caffeine (1-2 cups daily) and avoiding alcohol. She will schedule ultrasound for dating ASAP. Her initial OB visit in the next month ( 12 wks , need dating scan) . Feel free to call with any questions. Diclegis ordered for nausea.   Doreene Burke, CNM

## 2022-09-06 NOTE — Patient Instructions (Signed)
Prenatal Care Prenatal care is health care during pregnancy. It helps you and your unborn baby (fetus) stay as healthy as possible. Prenatal care may be provided by a midwife, a family practice doctor, a mid-level practitioner (nurse practitioner or physician assistant), or a childbirth and pregnancy doctor (obstetrician). How does this affect me? During pregnancy, you will be closely monitored for any new conditions that might develop. To lower your risk of pregnancy complications, you and your health care provider will talk about any underlying conditions you have. How does this affect my baby? Early and consistent prenatal care increases the chance that your baby will be healthy during pregnancy. Prenatal care lowers the risk that your baby will be: Born early (prematurely). Smaller than expected at birth (small for gestational age). What can I expect at the first prenatal care visit? Your first prenatal care visit will likely be the longest. You should schedule your first prenatal care visit as soon as you know that you are pregnant. Your first visit is a good time to talk about any questions or concerns you have about pregnancy. Medical history At your visit, you and your health care provider will talk about your medical history, including: Any past pregnancies. Your family's medical history. Medical history of the baby's father. Any long-term (chronic) health conditions you have and how you manage them. Any surgeries or procedures you have had. Any current over-the-counter or prescription medicines, herbs, or supplements that you are taking. Other factors that could pose a risk to your baby, including: Exposure to harmful chemicals or radiation at work or at home. Any substance use, including tobacco, alcohol, and drug use. Your home setting and your stress levels, including: Exposure to abuse or violence. Household financial strain. Your daily health habits, including diet and  exercise. Tests and screenings Your health care provider will: Measure your weight, height, and blood pressure. Do a physical exam, including a pelvic and breast exam. Perform blood tests and urine tests to check for: Urinary tract infection. Sexually transmitted infections (STIs). Low iron levels in your blood (anemia). Blood type and certain proteins on red blood cells (Rh antibodies). Infections and immunity to viruses, such as hepatitis B and rubella. HIV (human immunodeficiency virus). Discuss your options for genetic screening. Tips about staying healthy Your health care provider will also give you information about how to keep yourself and your baby healthy, including: Nutrition and taking vitamins. Physical activity. How to manage pregnancy symptoms such as nausea and vomiting (morning sickness). Infections and substances that may be harmful to your baby and how to avoid them. Food safety. Dental care. Working. Travel. Warning signs to watch for and when to call your health care provider. How often will I have prenatal care visits? After your first prenatal care visit, you will have regular visits throughout your pregnancy. The visit schedule is often as follows: Up to week 28 of pregnancy: once every 4 weeks. 28-36 weeks: once every 2 weeks. After 36 weeks: every week until delivery. Some women may have visits more or less often depending on any underlying health conditions and the health of the baby. Keep all follow-up and prenatal care visits. This is important. What happens during routine prenatal care visits? Your health care provider will: Measure your weight and blood pressure. Check for fetal heart sounds. Measure the height of your uterus in your abdomen (fundal height). This may be measured starting around week 20 of pregnancy. Check the position of your baby inside your uterus. Ask questions   about your diet, sleeping patterns, and whether you can feel the baby  move. Review warning signs to watch for and signs of labor. Ask about any pregnancy symptoms you are having and how you are dealing with them. Symptoms may include: Headaches. Nausea and vomiting. Vaginal discharge. Swelling. Fatigue. Constipation. Changes in your vision. Feeling persistently sad or anxious. Any discomfort, including back or pelvic pain. Bleeding or spotting. Make a list of questions to ask your health care provider at your routine visits. What tests might I have during prenatal care visits? You may have blood, urine, and imaging tests throughout your pregnancy, such as: Urine tests to check for glucose, protein, or signs of infection. Glucose tests to check for a form of diabetes that can develop during pregnancy (gestational diabetes mellitus). This is usually done around week 24 of pregnancy. Ultrasounds to check your baby's growth and development, to check for birth defects, and to check your baby's well-being. These can also help to decide when you should deliver your baby. A test to check for group B strep (GBS) infection. This is usually done around week 36 of pregnancy. Genetic testing. This may include blood, fluid, or tissue sampling, or imaging tests, such as an ultrasound. Some genetic tests are done during the first trimester and some are done during the second trimester. What else can I expect during prenatal care visits? Your health care provider may recommend getting certain vaccines during pregnancy. These may include: A yearly flu shot (annual influenza vaccine). This is especially important if you will be pregnant during flu season. Tdap (tetanus, diphtheria, pertussis) vaccine. Getting this vaccine during pregnancy can protect your baby from whooping cough (pertussis) after birth. This vaccine may be recommended between weeks 27 and 36 of pregnancy. A COVID-19 vaccine. Later in your pregnancy, your health care provider may give you information  about: Childbirth and breastfeeding classes. Choosing a health care provider for your baby. Umbilical cord banking. Breastfeeding. Birth control after your baby is born. The hospital labor and delivery unit and how to set up a tour. Registering at the hospital before you go into labor. Where to find more information Office on Women's Health: womenshealth.gov American Pregnancy Association: americanpregnancy.org March of Dimes: marchofdimes.org Summary Prenatal care helps you and your baby stay as healthy as possible during pregnancy. Your first prenatal care visit will most likely be the longest. You will have visits and tests throughout your pregnancy to monitor your health and your baby's health. Bring a list of questions to your visits to ask your health care provider. Make sure to keep all follow-up and prenatal care visits. This information is not intended to replace advice given to you by your health care provider. Make sure you discuss any questions you have with your health care provider. Document Revised: 11/20/2019 Document Reviewed: 11/20/2019 Elsevier Patient Education  2024 Elsevier Inc.  

## 2022-09-08 ENCOUNTER — Other Ambulatory Visit: Payer: Self-pay | Admitting: Certified Nurse Midwife

## 2022-09-08 ENCOUNTER — Ambulatory Visit
Admission: RE | Admit: 2022-09-08 | Discharge: 2022-09-08 | Disposition: A | Discharge status: Home / Self Care | Payer: Medicaid Other | Source: Ambulatory Visit | Attending: Certified Nurse Midwife | Admitting: Certified Nurse Midwife

## 2022-09-08 DIAGNOSIS — N912 Amenorrhea, unspecified: Secondary | ICD-10-CM | POA: Diagnosis present

## 2022-09-08 DIAGNOSIS — Z3480 Encounter for supervision of other normal pregnancy, unspecified trimester: Secondary | ICD-10-CM | POA: Diagnosis present

## 2022-09-08 DIAGNOSIS — Z3689 Encounter for other specified antenatal screening: Secondary | ICD-10-CM | POA: Insufficient documentation

## 2022-09-08 DIAGNOSIS — Z3482 Encounter for supervision of other normal pregnancy, second trimester: Secondary | ICD-10-CM | POA: Insufficient documentation

## 2022-09-08 DIAGNOSIS — Z3A18 18 weeks gestation of pregnancy: Secondary | ICD-10-CM | POA: Diagnosis not present

## 2022-09-11 ENCOUNTER — Encounter: Payer: Self-pay | Admitting: Obstetrics

## 2022-09-11 ENCOUNTER — Ambulatory Visit (INDEPENDENT_AMBULATORY_CARE_PROVIDER_SITE_OTHER): Payer: Medicaid Other | Admitting: Obstetrics

## 2022-09-11 ENCOUNTER — Other Ambulatory Visit (HOSPITAL_COMMUNITY)
Admission: RE | Admit: 2022-09-11 | Discharge: 2022-09-11 | Disposition: A | Discharge status: Home / Self Care | Payer: Medicaid Other | Source: Ambulatory Visit | Attending: Obstetrics | Admitting: Obstetrics

## 2022-09-11 VITALS — BP 99/64 | HR 94 | Ht 61.0 in | Wt 170.0 lb

## 2022-09-11 DIAGNOSIS — Z3481 Encounter for supervision of other normal pregnancy, first trimester: Secondary | ICD-10-CM

## 2022-09-11 DIAGNOSIS — Z3A01 Less than 8 weeks gestation of pregnancy: Secondary | ICD-10-CM | POA: Diagnosis not present

## 2022-09-11 DIAGNOSIS — Z3480 Encounter for supervision of other normal pregnancy, unspecified trimester: Secondary | ICD-10-CM

## 2022-09-11 DIAGNOSIS — Z113 Encounter for screening for infections with a predominantly sexual mode of transmission: Secondary | ICD-10-CM | POA: Diagnosis present

## 2022-09-11 MED ORDER — ONDANSETRON HCL 4 MG PO TABS: 4.0000 mg | ORAL_TABLET | Freq: Three times a day (TID) | ORAL | 0 refills | Status: DC | PRN

## 2022-09-11 MED ORDER — PRENATAL PLUS VITAMIN/MINERAL 27-1 MG PO TABS: 1.0000 | ORAL_TABLET | Freq: Every day | ORAL | 3 refills | Status: DC

## 2022-09-11 MED ORDER — DOXYLAMINE-PYRIDOXINE 10-10 MG PO TBEC: 1.0000 | DELAYED_RELEASE_TABLET | Freq: Every day | ORAL | 3 refills | Status: DC

## 2022-09-11 NOTE — Progress Notes (Signed)
NEW OB HISTORY AND PHYSICAL  SUBJECTIVE:       Sarah Martinez Sarah Martinez is a 22 y.o. [phone] female, No LMP recorded (lmp unknown)., Estimated Date of Delivery: None noted., Unknown, presents today for establishment of Prenatal Care. This pregnancy was unplanned and her periods are irregular. She had an ultrasound on 09/08/22 and the report is not yet available. Renata says she was told she is about [redacted] weeks pregnant. She reports nausea and dry mouth.  She had some vaginal itching last week that has mostly resolved. She would like a swab today. She declines a Pap today but will want one postpartum. She has a h/o 1 preterm birth and one full-term birth. She denies complications with either of these pregnancies or births, but did have HG with her second.  Social history Partner/Relationship: FOB involved. Feels safe in her relationship. Living situation: lives with mom, sister, and children Work: [employer] Exercise: none  Substance use: denies   Gynecologic History No LMP recorded (lmp unknown).  She has a h/o irregular periods Contraception: none Last Pap: never. Results were: N/A  Obstetric History OB History  Gravida Para Term Preterm AB Living  2 2 1 1  0 2  SAB IAB Ectopic Multiple Live Births        0 2    # Outcome Date GA Lbr Len/2nd Weight Sex Type Anes PTL Lv  2 Term 08/15/20 [redacted]w[redacted]d 04:39 / 03:52 8 lb 13.8 oz (4.02 kg) F Vag-Spont EPI  LIV  1 Preterm 04/05/15 [redacted]w[redacted]d  6 lb 15 oz (3.147 kg) M Vag-Spont  Y LIV    No past medical history on file.  No past surgical history on file.  Current Outpatient Medications on File Prior to Visit  Medication Sig Dispense Refill   Doxylamine-Pyridoxine (DICLEGIS) 10-10 MG TBEC Take 1 tablet by mouth daily at 6 (six) AM. 120 tablet 3   famotidine (PEPCID) 20 MG tablet Take 1 tablet (20 mg total) by mouth 2 (two) times daily. 60 tablet 1   Prenatal Vit-Fe Fumarate-FA (PRENATAL MULTIVITAMIN) TABS tablet Take 1 tablet by mouth daily at 12 noon.      No current facility-administered medications on file prior to visit.    No Known Allergies  Social History   Socioeconomic History   Marital status: Single    Spouse name: Not on file   Number of children: Not on file   Years of education: Not on file   Highest education level: Not on file  Occupational History   Not on file  Tobacco Use   Smoking status: Never   Smokeless tobacco: Never  Substance and Sexual Activity   Alcohol use: No   Drug use: No   Sexual activity: Yes  Other Topics Concern   Not on file  Social History Narrative   Not on file   Social Determinants of Health   Financial Resource Strain: Not on file  Food Insecurity: Not on file  Transportation Needs: Not on file  Physical Activity: Not on file  Stress: Not on file  Social Connections: Not on file  Intimate Partner Violence: Not on file    No family history on file.  The following portions of the patient's history were reviewed and updated as appropriate: allergies, current medications, past OB history, past medical history, past surgical history, past family history, past social history, and problem list.  Constitutional: Denied constitutional symptoms, night sweats, recent illness, fatigue, fever, insomnia and weight loss.  Eyes: Denied eye symptoms,  eye pain, photophobia, vision change and visual disturbance.  Ears/Nose/Throat/Neck: Denied ear, nose, throat or neck symptoms, hearing loss, nasal discharge, sinus congestion and sore throat.  Cardiovascular: Denied cardiovascular symptoms, arrhythmia, chest pain/pressure, edema, exercise intolerance, orthopnea and palpitations.  Respiratory: Denied pulmonary symptoms, asthma, pleuritic pain, productive sputum, cough, dyspnea and wheezing.  Gastrointestinal: Denied, gastro-esophageal reflux, melena. +nausea and vomiting.  Genitourinary: Denied genitourinary symptoms including symptomatic vaginal discharge, pelvic relaxation issues, and urinary  complaints.  Musculoskeletal: Denied musculoskeletal symptoms, stiffness, swelling, muscle weakness and myalgia. +bilateral ankle pain since the birth of her second child  Dermatologic: Denied dermatology symptoms, rash and scar.  Neurologic: Denied neurology symptoms, dizziness, headache, neck pain and syncope.  Psychiatric: Denied psychiatric symptoms, anxiety and depression.  Endocrine: Denied endocrine symptoms including hot flashes and night sweats.     OBJECTIVE: Initial Physical Exam (New OB)  GENERAL APPEARANCE: alert, well appearing HEAD: normocephalic, atraumatic MOUTH: mucous membranes moist, pharynx normal without lesions THYROID: no thyromegaly or masses present BREASTS: no masses noted, no significant tenderness, no palpable axillary nodes, no skin changes LUNGS: clear to auscultation, no wheezes, rales or rhonchi, symmetric air entry HEART: regular rate and rhythm, no murmurs ABDOMEN: soft, nontender, nondistended, no abnormal masses, no epigastric pain, fundus soft, nontender, 1 FB above umbilicus, and FHT present EXTREMITIES: no redness or tenderness in the calves or thighs SKIN: normal coloration and turgor, no rashes LYMPH NODES: no adenopathy palpable NEUROLOGIC: alert, oriented, normal speech, no focal findings or movement disorder noted  PELVIC EXAM deferred  ASSESSMENT: Normal pregnancy at approximately 18 weeks  PLAN: Routine prenatal care. We discussed an overview of prenatal care and when to call. Reviewed diet, exercise, and weight gain recommendations in pregnancy. Discussed benefits of breastfeeding and lactation resources at [facility]. NOB labs and genetic screening today. Will review [place] once report available and order anatomy scan if needed.  See orders  Guadlupe Spanish, CNM

## 2022-09-12 LAB — URINALYSIS, ROUTINE W REFLEX MICROSCOPIC
Bilirubin, UA: NEGATIVE
Glucose, UA: NEGATIVE
Ketones, UA: NEGATIVE
Leukocytes,UA: NEGATIVE
Nitrite, UA: NEGATIVE
Protein,UA: NEGATIVE
RBC, UA: NEGATIVE
Specific Gravity, UA: 1.009 (ref 1.005–1.030)
Urobilinogen, Ur: 0.2 mg/dL (ref 0.2–1.0)
pH, UA: 7 (ref 5.0–7.5)

## 2022-09-13 LAB — CBC/D/PLT+RPR+RH+ABO+RUBIGG...
Antibody Screen: NEGATIVE
Basophils Absolute: 0 10*3/uL (ref 0.0–0.2)
Basos: 0 %
EOS (ABSOLUTE): 0.1 10*3/uL (ref 0.0–0.4)
Eos: 1 %
HCV Ab: NONREACTIVE
HIV Screen 4th Generation wRfx: NONREACTIVE
Hematocrit: 33 % — ABNORMAL LOW (ref 34.0–46.6)
Hemoglobin: 11.3 g/dL (ref 11.1–15.9)
Hepatitis B Surface Ag: NEGATIVE
Immature Grans (Abs): 0.1 10*3/uL (ref 0.0–0.1)
Immature Granulocytes: 1 %
Lymphocytes Absolute: 1.8 10*3/uL (ref 0.7–3.1)
Lymphs: 22 %
MCH: 30.3 pg (ref 26.6–33.0)
MCHC: 34.2 g/dL (ref 31.5–35.7)
MCV: 89 fL (ref 79–97)
Monocytes Absolute: 0.5 10*3/uL (ref 0.1–0.9)
Monocytes: 6 %
Neutrophils Absolute: 5.6 10*3/uL (ref 1.4–7.0)
Neutrophils: 70 %
Platelets: 267 10*3/uL (ref 150–450)
RBC: 3.73 x10E6/uL — ABNORMAL LOW (ref 3.77–5.28)
RDW: 13.3 % (ref 11.7–15.4)
RPR Ser Ql: NONREACTIVE
Rh Factor: POSITIVE
Rubella Antibodies, IGG: <0.9 index — ABNORMAL LOW (ref >0.99)
Varicella zoster IgG: <135 index — ABNORMAL LOW (ref >165)
WBC: 8 10*3/uL (ref 3.4–10.8)

## 2022-09-13 LAB — CERVICOVAGINAL ANCILLARY ONLY
Bacterial Vaginitis (gardnerella): NEGATIVE
Candida Glabrata: NEGATIVE
Candida Vaginitis: POSITIVE — AB
Chlamydia: NEGATIVE
Comment: NEGATIVE
Comment: NEGATIVE
Comment: NEGATIVE
Comment: NEGATIVE
Comment: NEGATIVE
Comment: NORMAL
Neisseria Gonorrhea: NEGATIVE
Trichomonas: NEGATIVE

## 2022-09-13 LAB — TOXOPLASMA ANTIBODIES- IGG AND  IGM
Toxoplasma Antibody- IgM: 4.5 AU/mL (ref 0.0–7.9)
Toxoplasma IgG Ratio: <3 IU/mL (ref 0.0–7.1)

## 2022-09-13 LAB — URINE CULTURE, OB REFLEX

## 2022-09-13 LAB — HCV INTERPRETATION

## 2022-09-13 LAB — CULTURE, OB URINE

## 2022-09-14 ENCOUNTER — Encounter: Payer: Self-pay | Admitting: Obstetrics

## 2022-09-14 ENCOUNTER — Other Ambulatory Visit: Payer: Self-pay | Admitting: Obstetrics

## 2022-09-14 MED ORDER — MICONAZOLE NITRATE 2 % VA CREA: 1.0000 | TOPICAL_CREAM | Freq: Every day | VAGINAL | 0 refills | Status: DC

## 2022-09-15 LAB — MATERNIT 21 PLUS CORE, BLOOD

## 2022-09-15 LAB — MONITOR DRUG PROFILE 14(MW): Fentanyl, Urine: NEGATIVE pg/mL

## 2022-09-25 IMAGING — US US OB FOLLOW-UP
1 series · 13 of 28 positions shown · non-contrast
Comparison: none

CLINICAL DATA: Follow-up fetal anatomy

EXAM:
OBSTETRIC 14+ WK ULTRASOUND FOLLOW-UP

[Series 1: us ob follow-up · 0.20mm/px · 13 of 79 slices shown]
[im 3/79]
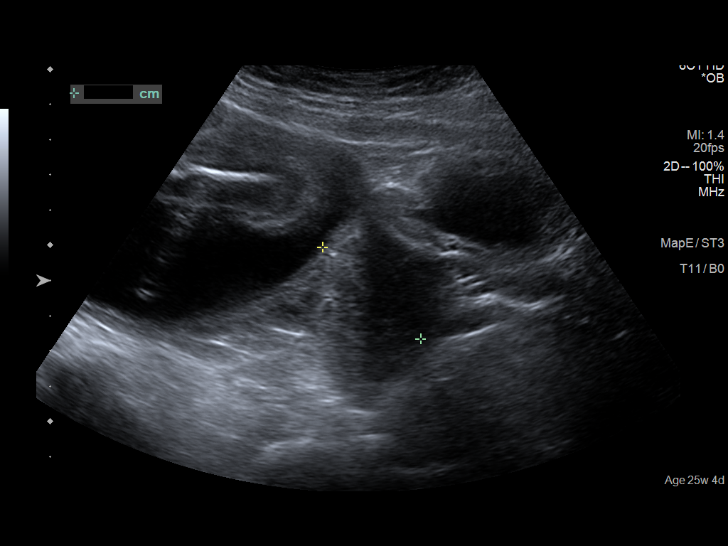
[im 9/79]
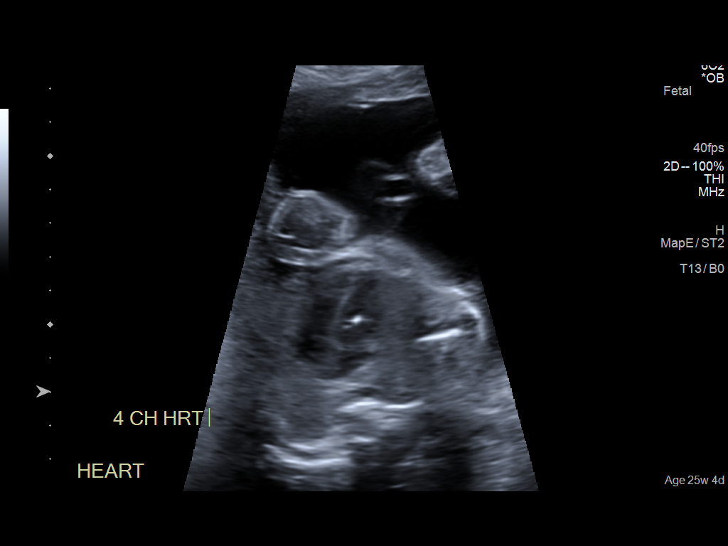
[im 15/79]
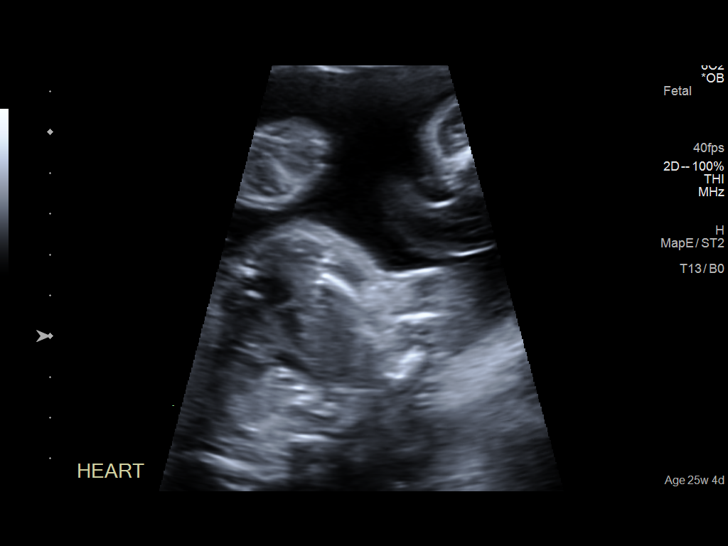
[im 21/79]
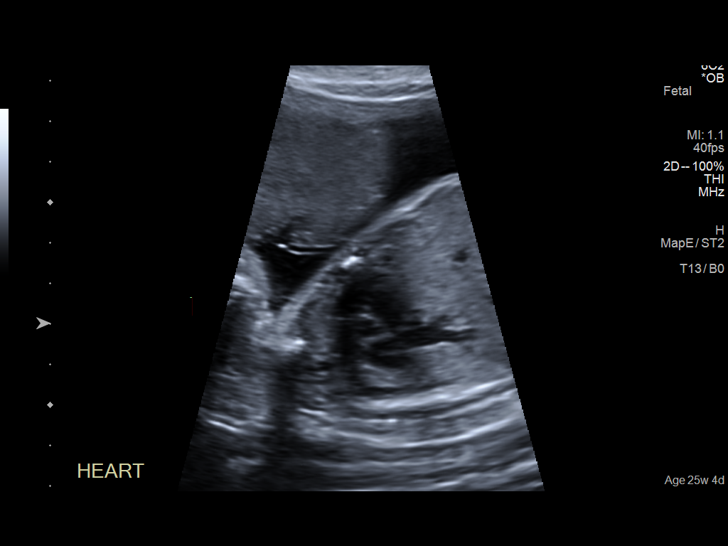
[im 27/79]
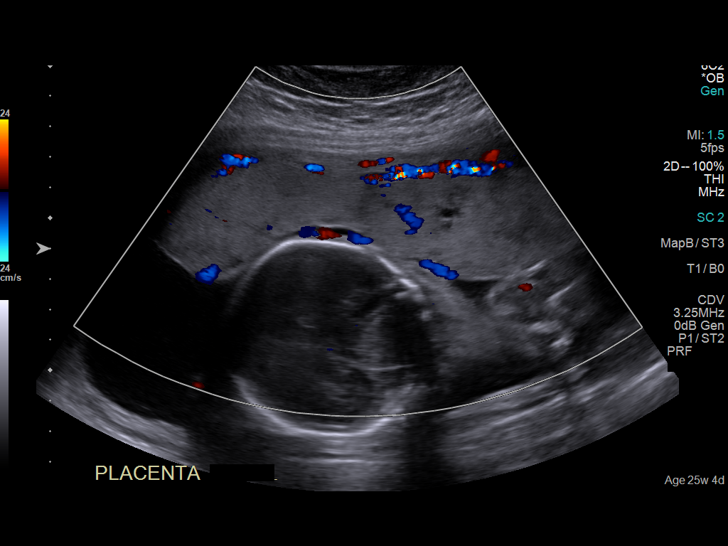
[im 32/79]
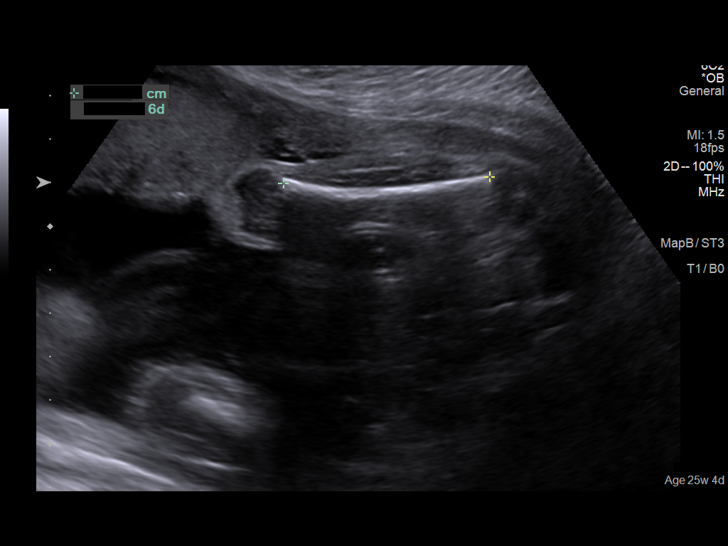
[im 41/79]
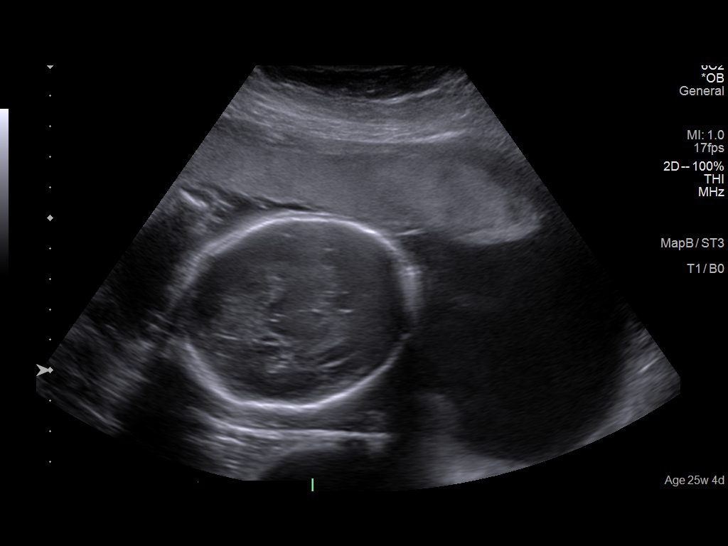
[im 47/79]
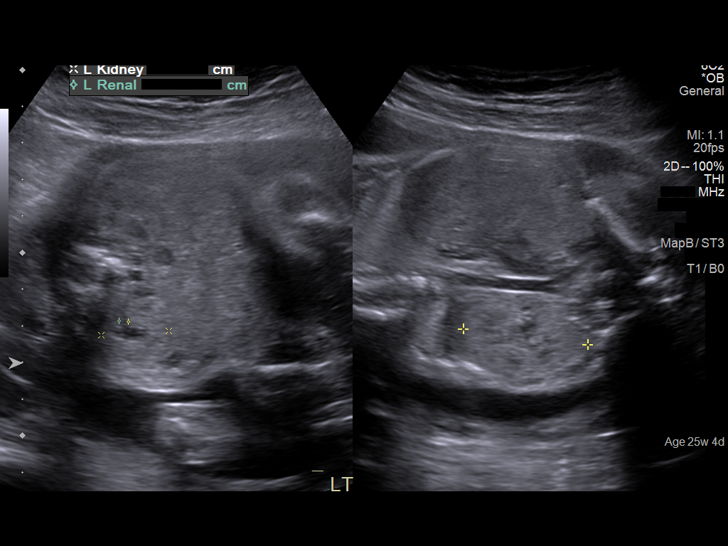
[im 53/79]
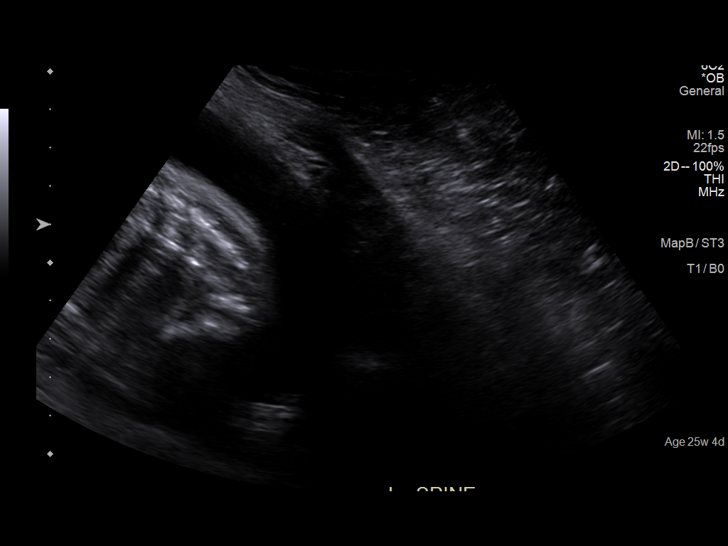
[im 58/79]
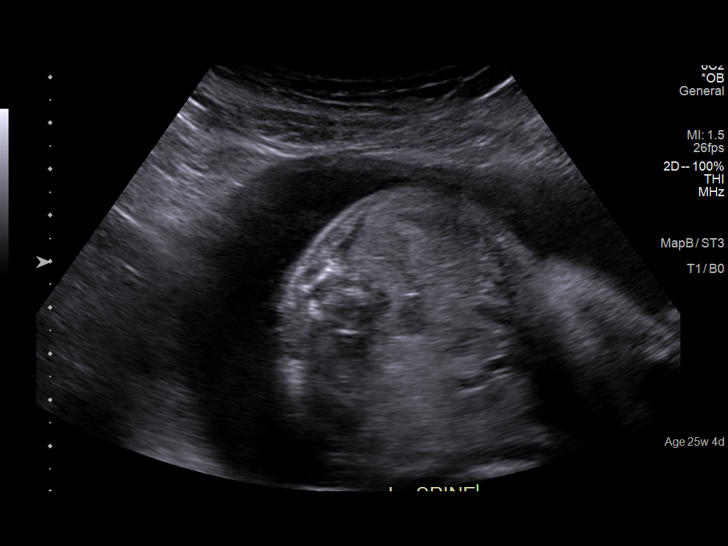
[im 64/79]
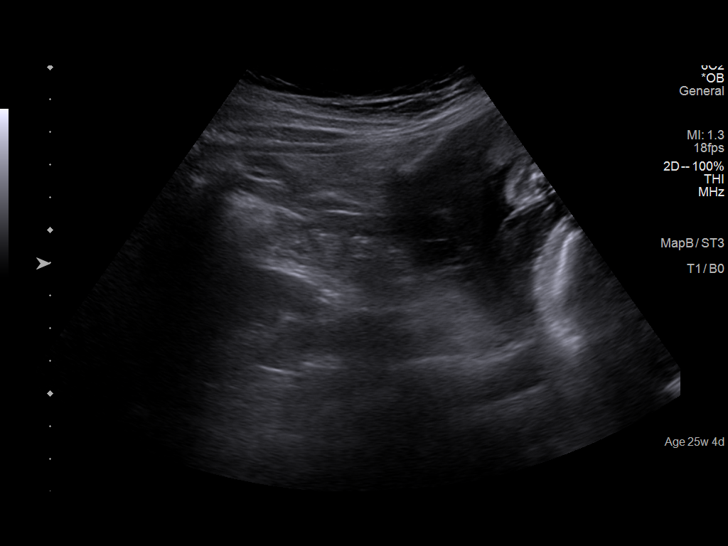
[im 70/79]
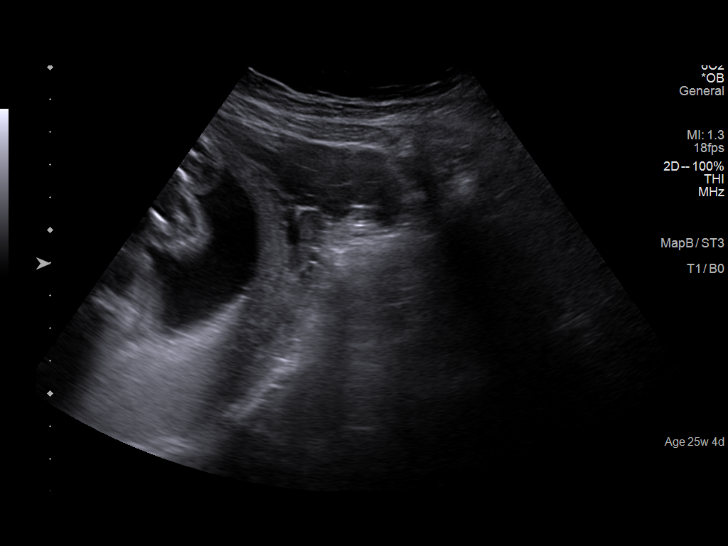
[im 76/79]
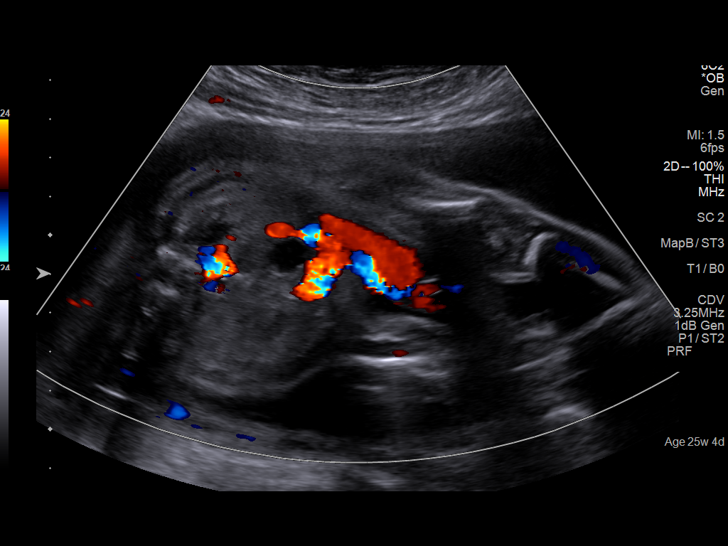

[13 of 28 positions shown; findings below may reference images not displayed]

FINDINGS: Number of Fetuses: 1

Heart Rate:  158 bpm

Movement: Yes

Presentation: Breech

Previa: No

Placental Location: Anterior

Amniotic Fluid (Subjective): Normal

Amniotic Fluid (Objective):

Vertical pocket 4.4cm

FETAL BIOMETRY

BPD:  6.2cm 25w 0d

HC:    22.8cm 24w 5d

AC:    21.0cm 25w 4d

FL:    4.7cm 25w 6d

Current Mean GA: 25w 2d US EDC: 08/23/2020

Assigned GA: 25w 4d Assigned EDC: 08/21/2020

Estimated Fetal Weight:  824g 38%ile

FETAL ANATOMY

Lateral Ventricles: Previously seen

Thalami/CSP: Previously seen

Posterior Fossa: Previously seen

Nuchal Region: Previously seen

Upper Lip: Previously seen

Spine: Appears normal

4 Chamber Heart on Left: Previously seen. 4.5 mm echogenic focus
noted within the left ventricle.

LVOT: Previously seen

RVOT: Previously seen

Stomach on Left: Previously seen

3 Vessel Cord: Previously seen

Cord Insertion site: Previously seen

Kidneys: Previously seen

Bladder: Previously seen

Extremities: Previously seen

Sex: Previously Seen

Technical Limitations: Fetal positioning

Maternal Findings:

Cervix:  Closed.  3.8 cm.
IMPRESSION: 1. Single live intrauterine gestation in breech presentation.
Assigned gestational age of 25 weeks 4 days.
2. Adequate interval growth.
3. Adequate visualization of fetal spine. Fetal anatomic survey is
now complete. No fetal anomalies identified.
4. Single intracardiac echogenic focus noted within the left
ventricle. This is often considered as a benign variant in the
absence of maternal risk factors or other sonographic anomaly.

## 2022-10-09 ENCOUNTER — Encounter: Payer: Self-pay | Admitting: Certified Nurse Midwife

## 2022-10-09 ENCOUNTER — Other Ambulatory Visit (HOSPITAL_COMMUNITY)
Admission: RE | Admit: 2022-10-09 | Discharge: 2022-10-09 | Disposition: A | Discharge status: Home / Self Care | Payer: Medicaid Other | Source: Ambulatory Visit | Attending: Certified Nurse Midwife | Admitting: Certified Nurse Midwife

## 2022-10-09 ENCOUNTER — Ambulatory Visit (INDEPENDENT_AMBULATORY_CARE_PROVIDER_SITE_OTHER): Payer: Medicaid Other | Admitting: Certified Nurse Midwife

## 2022-10-09 VITALS — BP 111/68 | HR 107 | Wt 168.0 lb

## 2022-10-09 DIAGNOSIS — Z3A22 22 weeks gestation of pregnancy: Secondary | ICD-10-CM

## 2022-10-09 DIAGNOSIS — Z3480 Encounter for supervision of other normal pregnancy, unspecified trimester: Secondary | ICD-10-CM | POA: Insufficient documentation

## 2022-10-09 DIAGNOSIS — O26892 Other specified pregnancy related conditions, second trimester: Secondary | ICD-10-CM | POA: Insufficient documentation

## 2022-10-09 DIAGNOSIS — O99891 Other specified diseases and conditions complicating pregnancy: Secondary | ICD-10-CM

## 2022-10-09 DIAGNOSIS — N898 Other specified noninflammatory disorders of vagina: Secondary | ICD-10-CM

## 2022-10-09 DIAGNOSIS — O09899 Supervision of other high risk pregnancies, unspecified trimester: Secondary | ICD-10-CM | POA: Insufficient documentation

## 2022-10-09 DIAGNOSIS — O09212 Supervision of pregnancy with history of pre-term labor, second trimester: Secondary | ICD-10-CM

## 2022-10-09 NOTE — Patient Instructions (Signed)
 Gestational Diabetes Mellitus, Diagnosis Gestational diabetes mellitus, or gestational diabetes, is diabetes that some people get when pregnant. This condition usually happens at 24-28 weeks of pregnancy.  People with gestational diabetes have high blood sugar. If you do not get treated for this condition, it may cause problems for you and your baby. It goes away after you give birth but can happen again the next time you become pregnant. What are the causes? This condition is caused by changes in your body when you're pregnant. When these changes happen: The pancreas may not make enough insulin. The body may not use insulin in the right way. This is called insulin resistance. Insulin helps your body use sugar for energy. If your body doesn't have enough insulin or can't use the insulin that it has, extra sugars stay in your blood. This leads to high blood sugar (hyperglycemia). What increases the risk? Being older than age 70 when pregnant. Too much body weight. Having polycystic ovary syndrome (PCOS). Having someone with diabetes in your family. Having had this condition in the past. Being pregnant with more than one baby. What are the signs or symptoms? You may not have any symptoms. If you do have symptoms, they may include: Being thirsty often. Being hungry often. Needing to pee more often. These symptoms can be missed because they're similar to other symptoms of pregnancy. How is this diagnosed? This condition may be diagnosed based on your blood sugar level and how your body responds to glucose. This may be checked with an oral glucose tolerance test (OGTT). The test may be done: Early in pregnancy if you have risk factors. At 24-28 weeks of your pregnancy. How is this treated? To treat this condition, you may be told to: Eat a healthy diet. Get more exercise. Check your blood sugar often. Your health care provider will tell you what your target is. Take insulin and other  medicines. These are taken if needed. Work with a diabetes expert. Follow these instructions at home: Learn about your diabetes Ask your provider: How often should I check my blood sugar? Where do I get the equipment? What medicines do I need? When should I take them? Do I need to meet with an educator? Who can I call if I have questions? Where can I find a support group? General instructions Take medicines only as told by your provider. Stay at a healthy weight. Drink enough fluid to keep your pee (urine) pale yellow. Check your pee for ketones when sick and as told. Ketones in your pee is a sign that your body is using fat for energy because it's not making enough insulin. Wear an alert bracelet or carry a card that shows you have this condition. Keep all follow-up visits. Your provider needs to check your health and your baby's growth. Where to find more information Gestational Diabetes: American Diabetes Association (ADA): diabetes.org Gestational Diabetes and Pregnancy: Centers for Disease Control and Prevention (CDC): TonerPromos.no American Pregnancy Association: americanpregnancy.org [place] MyPlate: WrestlingReporter.dk Contact a health care provider if:  Your blood sugar is above your target for two tests in a row. You have a high blood sugar level and you also have ketones in your pee. You have been sick or have had a fever for 2 days or more and aren't getting better. You have any of these problems for more than 6 hours: You can't eat or drink. You vomit or feel like you may vomit. You have watery poop (diarrhea). Get help right away if:  You become confused or cannot think clearly. You have trouble breathing. You have chest pain. Your baby is moving less than usual. You have unusual discharge or bleeding from your vagina. You have cramping in your belly or have pain in your hips or lower back. You have symptoms of high blood pressure or preeclampsia. These include: A severe,  throbbing headache that doesn't go away. Sudden or extreme swelling of your face, hands, legs, or feet. Vision problems, such as: Seeing spots. Blurry vision. Sensitivity to light. These symptoms may be an emergency. Get help right away. Call 911. Do not wait to see if the symptoms will go away. Do not drive yourself to the hospital. This information is not intended to replace advice given to you by your health care provider. Make sure you discuss any questions you have with your health care provider. Document Revised: 05/19/2022 Document Reviewed: 05/19/2022 Elsevier Patient Education  2024 Elsevier Inc.  Second Trimester of Pregnancy  The second trimester of pregnancy is from week 13 through week 27. This is months 4 through 6 of pregnancy. The second trimester is often a time when you feel your best. Your body has adjusted to being pregnant, and you begin to feel better physically. During the second trimester: Morning sickness has lessened or stopped completely. You may have more energy. You may have an increase in appetite. The second trimester is also a time when the unborn baby (fetus) is growing rapidly. At the end of the sixth month, the fetus may be up to 12 inches long and weigh about 1 pounds. You will likely begin to feel the baby move (quickening) between 16 and 20 weeks of pregnancy. Body changes during your second trimester Your body continues to go through many changes during your second trimester. The changes vary and generally return to normal after the baby is born. Physical changes Your weight will continue to increase. You will notice your lower abdomen bulging out. You may begin to get stretch marks on your hips, abdomen, and breasts. Your breasts will continue to grow and to become tender. Dark spots or blotches (chloasma or mask of pregnancy) may develop on your face. A dark line from your belly button to the pubic area (linea nigra) may appear. You may have  changes in your hair. These can include thickening of your hair, rapid growth, and changes in texture. Some people also have hair loss during or after pregnancy, or hair that feels dry or thin. Health changes You may develop headaches. You may have heartburn. You may develop constipation. You may develop hemorrhoids or swollen, bulging veins (varicose veins). Your gums may bleed and may be sensitive to brushing and flossing. You may urinate more often because the fetus is pressing on your bladder. You may have back pain. This is caused by: Weight gain. Pregnancy hormones that are relaxing the joints in your pelvis. A shift in weight and the muscles that support your balance. Follow these instructions at home: Medicines Follow your health care provider's instructions regarding medicine use. Specific medicines may be either safe or unsafe to take during pregnancy. Do not take any medicines unless approved by your health care provider. Take a prenatal vitamin that contains at least 600 micrograms (mcg) of folic acid. Eating and drinking Eat a healthy diet that includes fresh fruits and vegetables, whole grains, good sources of protein such as meat, eggs, or tofu, and low-fat dairy products. Avoid raw meat and unpasteurized juice, milk, and cheese. These carry germs  that can harm you and your baby. You may need to take these actions to prevent or treat constipation: Drink enough fluid to keep your urine pale yellow. Eat foods that are high in fiber, such as beans, whole grains, and fresh fruits and vegetables. Limit foods that are high in fat and processed sugars, such as fried or sweet foods. Activity Exercise only as directed by your health care provider. Most people can continue their usual exercise routine during pregnancy. Try to exercise for 30 minutes at least 5 days a week. Stop exercising if you develop contractions in your uterus. Stop exercising if you develop pain or cramping in the  lower abdomen or lower back. Avoid exercising if it is very hot or humid or if you are at a high altitude. Avoid heavy lifting. If you choose to, you may have sex unless your health care provider tells you not to. Relieving pain and discomfort Wear a supportive bra to prevent discomfort from breast tenderness. Take warm sitz baths to soothe any pain or discomfort caused by hemorrhoids. Use hemorrhoid cream if your health care provider approves. Rest with your legs raised (elevated) if you have leg cramps or low back pain. If you develop varicose veins: Wear support hose as told by your health care provider. Elevate your feet for 15 minutes, 3-4 times a day. Limit salt in your diet. Safety Wear your seat belt at all times when driving or riding in a car. Talk with your health care provider if someone is verbally or physically abusive to you. Lifestyle Do not use hot tubs, steam rooms, or saunas. Do not douche. Do not use tampons or scented sanitary pads. Avoid cat litter boxes and soil used by cats. These carry germs that can cause birth defects in the baby and possibly loss of the fetus by miscarriage or stillbirth. Do not use herbal remedies, alcohol, illegal drugs, or medicines that are not approved by your health care provider. Chemicals in these products can harm your baby. Do not use any products that contain nicotine or tobacco, such as cigarettes, e-cigarettes, and chewing tobacco. If you need help quitting, ask your health care provider. General instructions During a routine prenatal visit, your health care provider will do a physical exam and other tests. He or she will also discuss your overall health. Keep all follow-up visits. This is important. Ask your health care provider for a referral to a local prenatal education class. Ask for help if you have counseling or nutritional needs during pregnancy. Your health care provider can offer advice or refer you to specialists for help  with various needs. Where to find more information American Pregnancy Association: americanpregnancy.org [employer]ologists: https://www.todd-brady.net/ Office on [employer] Health: MightyReward.co.nz Contact a health care provider if you have: A headache that does not go away when you take medicine. Vision changes or you see spots in front of your eyes. Mild pelvic cramps, pelvic pressure, or nagging pain in the abdominal area. Persistent nausea, vomiting, or diarrhea. A bad-smelling vaginal discharge or foul-smelling urine. Pain when you urinate. Sudden or extreme swelling of your face, hands, ankles, feet, or legs. A fever. Get help right away if you: Have fluid leaking from your vagina. Have spotting or bleeding from your vagina. Have severe abdominal cramping or pain. Have difficulty breathing. Have chest pain. Have fainting spells. Have not felt your baby move for the time period told by your health care provider. Have new or increased pain, swelling, or  redness in an arm or leg. Summary The second trimester of pregnancy is from week 13 through week 27 (months 4 through 6). Do not use herbal remedies, alcohol, illegal drugs, or medicines that are not approved by your health care provider. Chemicals in these products can harm your baby. Exercise only as directed by your health care provider. Most people can continue their usual exercise routine during pregnancy. Keep all follow-up visits. This is important. This information is not intended to replace advice given to you by your health care provider. Make sure you discuss any questions you have with your health care provider. Document Revised: 07/16/2019 Document Reviewed: 05/22/2019 Elsevier Patient Education  2024 [employer].

## 2022-10-09 NOTE — Progress Notes (Signed)
    Return Prenatal Note   Subjective   22 y.o. [id number] at [redacted]w[redacted]d presents for this follow-up prenatal visit.  Patient finished monistat course, concerned yeast not gone, self swab collected. Reports 2d of nausea, diarrhea, feeling better today, denies fevers or sick contacts. Follow up ultrasound for incomplete anatomy ordered. Patient reports: Movement: Present Contractions: Not present  Objective   Flow sheet Vitals: Pulse Rate: (!) 107 BP: 111/68 Fundal Height: 23 cm Fetal Heart Rate (bpm): 150 Total weight gain: 1 lb (0.454 kg)  General Appearance  No acute distress, well appearing, and well nourished Pulmonary   Normal work of breathing Neurologic   Alert and oriented to person, place, and time Psychiatric   Mood and affect within normal limits  Assessment/Plan   Plan  22 y.o. [id number] at [redacted]w[redacted]d presents for follow-up OB visit. Reviewed prenatal record including previous visit note. 1. Vaginal discharge during pregnancy in second trimester - Cervicovaginal ancillary only  2. Supervision of other normal pregnancy, antepartum - US OB Follow Up; Future  3. History of preterm delivery, currently pregnant   Follow up ultrasound ordered. Comfort measures & warning signs for GI upset reviewed.  Orders Placed This Encounter  Procedures   US OB Follow Up    Standing Status:   Future    Standing Expiration Date:   10/09/2023    Order Specific Question:   Reason for exam:    Answer:   incomplete anatomy ultrasound    Order Specific Question:   Preferred imaging location?    Answer:   [facility]   Return in 4 weeks (on 11/06/2022) for ROB.   Future Appointments  Date Time Provider Department Center  11/02/2022 10:30 AM [facility]  11/06/2022  9:15 AM Allie Bossier, MD [facility] None    For next visit:  continue with routine prenatal care     Dominica Severin, CNM  08/19/245:01 PM

## 2022-10-11 LAB — CERVICOVAGINAL ANCILLARY ONLY
Bacterial Vaginitis (gardnerella): NEGATIVE
Candida Glabrata: NEGATIVE
Candida Vaginitis: POSITIVE — AB
Chlamydia: NEGATIVE
Comment: NEGATIVE
Comment: NEGATIVE
Comment: NEGATIVE
Comment: NEGATIVE
Comment: NEGATIVE
Comment: NORMAL
Neisseria Gonorrhea: NEGATIVE
Trichomonas: NEGATIVE

## 2022-10-13 ENCOUNTER — Other Ambulatory Visit: Payer: Self-pay | Admitting: Certified Nurse Midwife

## 2022-10-13 MED ORDER — TERCONAZOLE 0.4 % VA CREA: 1.0000 | TOPICAL_CREAM | Freq: Every day | VAGINAL | 0 refills | Status: DC

## 2022-11-02 ENCOUNTER — Ambulatory Visit
Admission: RE | Admit: 2022-11-02 | Discharge: 2022-11-02 | Disposition: A | Discharge status: Home / Self Care | Payer: Medicaid Other | Source: Ambulatory Visit | Attending: Certified Nurse Midwife | Admitting: Certified Nurse Midwife

## 2022-11-02 DIAGNOSIS — Z3480 Encounter for supervision of other normal pregnancy, unspecified trimester: Secondary | ICD-10-CM

## 2022-11-02 DIAGNOSIS — Z3689 Encounter for other specified antenatal screening: Secondary | ICD-10-CM | POA: Diagnosis present

## 2022-11-06 ENCOUNTER — Ambulatory Visit (INDEPENDENT_AMBULATORY_CARE_PROVIDER_SITE_OTHER): Payer: Medicaid Other | Admitting: Obstetrics & Gynecology

## 2022-11-06 VITALS — BP 96/66 | HR 98 | Wt 180.0 lb

## 2022-11-06 DIAGNOSIS — O99210 Obesity complicating pregnancy, unspecified trimester: Secondary | ICD-10-CM | POA: Insufficient documentation

## 2022-11-06 DIAGNOSIS — Z3480 Encounter for supervision of other normal pregnancy, unspecified trimester: Secondary | ICD-10-CM

## 2022-11-06 DIAGNOSIS — E669 Obesity, unspecified: Secondary | ICD-10-CM

## 2022-11-06 DIAGNOSIS — Z3A26 26 weeks gestation of pregnancy: Secondary | ICD-10-CM

## 2022-11-06 DIAGNOSIS — O98112 Syphilis complicating pregnancy, second trimester: Secondary | ICD-10-CM

## 2022-11-06 DIAGNOSIS — B009 Herpesviral infection, unspecified: Secondary | ICD-10-CM | POA: Insufficient documentation

## 2022-11-06 NOTE — Progress Notes (Signed)
   PRENATAL VISIT NOTE  Subjective:  Sarah Martinez is a 22 y.o. [phone] at [redacted]w[redacted]d being seen today for ongoing prenatal care.  She is currently monitored for the following issues for this low-risk pregnancy and has Supervision of other normal pregnancy, antepartum; History of preterm delivery, currently pregnant; Herpes infection; and Obesity in pregnancy on their problem list.  Patient reports no complaints.  Contractions: Not present. Vag. Bleeding: None.  Movement: Present. Denies leaking of fluid.   The following portions of the patient's history were reviewed and updated as appropriate: allergies, current medications, past family history, past medical history, past social history, past surgical history and problem list.   Objective:   Vitals:   11/06/22 0934  BP: 96/66  Pulse: 98  Weight: 180 lb (81.6 kg)    Fetal Status:     Movement: Present     General:  Alert, oriented and cooperative. Patient is in no acute distress.  Skin: Skin is warm and dry. No rash noted.   Cardiovascular: Normal heart rate noted  Respiratory: Normal respiratory effort, no problems with respiration noted  Abdomen: Soft, gravid, appropriate for gestational age.  Pain/Pressure: Absent     Pelvic: Cervical exam deferred        Extremities: Normal range of motion.     Mental Status: Normal mood and affect. Normal behavior. Normal judgment and thought content.   FHR- 140s FH- 27 cm  Assessment and Plan:  Pregnancy: G3P1102 at [redacted]w[redacted]d 1. Herpes infection - I rec'd that she start valtrex by 36 weeks.   2. Supervision of other normal pregnancy, antepartum   3. Obesity in pregnancy - glucola at next visit  Preterm labor symptoms and general obstetric precautions including but not limited to vaginal bleeding, contractions, leaking of fluid and fetal movement were reviewed in detail with the patient. Please refer to After Visit Summary for other counseling recommendations.   Return for 2-3  weeks for 28 week labs.  No future appointments.  Allie Bossier, MD

## 2022-11-20 ENCOUNTER — Ambulatory Visit (INDEPENDENT_AMBULATORY_CARE_PROVIDER_SITE_OTHER): Payer: Medicaid Other | Admitting: Obstetrics

## 2022-11-20 ENCOUNTER — Other Ambulatory Visit (HOSPITAL_COMMUNITY)
Admission: RE | Admit: 2022-11-20 | Discharge: 2022-11-20 | Disposition: A | Discharge status: Home / Self Care | Payer: Medicaid Other | Source: Ambulatory Visit | Attending: Obstetrics | Admitting: Obstetrics

## 2022-11-20 ENCOUNTER — Other Ambulatory Visit: Payer: Medicaid Other

## 2022-11-20 ENCOUNTER — Other Ambulatory Visit: Payer: Self-pay

## 2022-11-20 VITALS — BP 99/58 | HR 121 | Wt 182.1 lb

## 2022-11-20 DIAGNOSIS — Z130 Encounter for screening for diseases of the blood and blood-forming organs and certain disorders involving the immune mechanism: Secondary | ICD-10-CM

## 2022-11-20 DIAGNOSIS — Z369 Encounter for antenatal screening, unspecified: Secondary | ICD-10-CM

## 2022-11-20 DIAGNOSIS — Z3483 Encounter for supervision of other normal pregnancy, third trimester: Secondary | ICD-10-CM

## 2022-11-20 DIAGNOSIS — Z3A28 28 weeks gestation of pregnancy: Secondary | ICD-10-CM

## 2022-11-20 DIAGNOSIS — Z113 Encounter for screening for infections with a predominantly sexual mode of transmission: Secondary | ICD-10-CM

## 2022-11-20 DIAGNOSIS — Z3480 Encounter for supervision of other normal pregnancy, unspecified trimester: Secondary | ICD-10-CM

## 2022-11-20 DIAGNOSIS — Z131 Encounter for screening for diabetes mellitus: Secondary | ICD-10-CM

## 2022-11-20 DIAGNOSIS — N898 Other specified noninflammatory disorders of vagina: Secondary | ICD-10-CM | POA: Insufficient documentation

## 2022-11-20 LAB — POCT URINALYSIS DIPSTICK OB
Bilirubin, UA: NEGATIVE
Blood, UA: NEGATIVE
Ketones, UA: NEGATIVE
Leukocytes, UA: NEGATIVE
Nitrite, UA: NEGATIVE
Spec Grav, UA: 1.02 (ref 1.010–1.025)
Urobilinogen, UA: 0.2 U/dL
pH, UA: 6 (ref 5.0–8.0)

## 2022-11-20 NOTE — Progress Notes (Signed)
Routine Prenatal Care Visit  Subjective  Sarah Martinez is a 22 y.o. [phone] at [redacted]w[redacted]d being seen today for ongoing prenatal care.  She is currently monitored for the following issues for this low-risk pregnancy and has Supervision of other normal pregnancy, antepartum; History of preterm delivery, currently pregnant; Herpes infection; and Obesity in pregnancy on their problem list.  ----------------------------------------------------------------------------------- Patient reports increased vaginal discharge. Sh has self swabbed to check for infection, BV, yeast.   Contractions: Regular. Vag. Bleeding: None.  Movement: Present. Leaking Fluid denies.  ----------------------------------------------------------------------------------- The following portions of the patient's history were reviewed and updated as appropriate: allergies, current medications, past family history, past medical history, past social history, past surgical history and problem list. Problem list updated.  Objective  Blood pressure (!) 99/58, pulse (!) 121, weight 182 lb 1.6 oz (82.6 kg). Pregravid weight 167 lb (75.8 kg) Total Weight Gain 15 lb 1.6 oz (6.849 kg) Urinalysis: Urine Protein Trace  Urine Glucose (!) Trace  Fetal Status:     Movement: Present     General:  Alert, oriented and cooperative. Patient is in no acute distress.  Skin: Skin is warm and dry. No rash noted.   Cardiovascular: Normal heart rate noted  Respiratory: Normal respiratory effort, no problems with respiration noted  Abdomen: Soft, gravid, appropriate for gestational age. Pain/Pressure: Present     Pelvic:  Cervical exam deferred        Extremities: Normal range of motion.  Edema: Trace  Mental Status: Normal mood and affect. Normal behavior. Normal judgment and thought content.   Assessment   22 y.o. [id number] at [redacted]w[redacted]d by  02/09/2023, by Ultrasound presenting for routine prenatal visit  Plan   G3 Problems (from 10/09/22 to  present)     Problem Noted Resolved   Supervision of other normal pregnancy, antepartum 10/09/2022 by Dominica Severin, CNM No   Overview Addendum 11/20/2022 10:58 AM by Tommie Raymond, CMA     Clinical Staff Provider  Office Location  [facility] Dating  02/09/2023, by Ultrasound  Language  English Anatomy US  7/19-incomplete  Flu Vaccine   Genetic Screen  NIPS: low risk XX  TDaP vaccine   declined Hgb A1C or  GTT Early : Third trimester :   Covid    LAB RESULTS   Rhogam  O/Positive/-- (07/22 1550)  Blood Type O/Positive/-- (07/22 1550)   Feeding Plan  Antibody Negative (07/22 1550)  Contraception  Rubella <0.90 (07/22 1550)  Circumcision  RPR Non Reactive (07/22 1550)   Pediatrician   HBsAg Negative (07/22 1550)   Support Person  HIV Non Reactive (07/22 1550)  Prenatal Classes  Varicella <135 (07/22 1550)    GBS  (For PCN allergy, check sensitivities)   BTL Consent  Hep C Non Reactive (07/22 1550)   VBAC Consent  Pap No results found for: "DIAGPAP"    Hgb Electro      CF      SMA              History of preterm delivery, currently pregnant 10/09/2022 by Dominica Severin, CNM No   Overview Signed 10/09/2022  4:54 PM by Dominica Severin, CNM    G1-spontaneous labor at 36+3, G2-39+         We will review her swab results and treat based on the lab.  Preterm labor symptoms and general obstetric precautions including but not limited to vaginal bleeding, contractions, leaking of fluid and fetal movement were reviewed  in detail with the patient. Please refer to After Visit Summary for other counseling recommendations.   Return in about 2 weeks (around 12/04/2022) for return OB, offer tdap again.  Mirna Mires, CNM  11/20/2022 12:55 PM

## 2022-11-21 ENCOUNTER — Other Ambulatory Visit: Payer: Self-pay | Admitting: Certified Nurse Midwife

## 2022-11-21 LAB — CERVICOVAGINAL ANCILLARY ONLY
Bacterial Vaginitis (gardnerella): NEGATIVE
Candida Glabrata: NEGATIVE
Candida Vaginitis: POSITIVE — AB
Chlamydia: NEGATIVE
Comment: NEGATIVE
Comment: NEGATIVE
Comment: NEGATIVE
Comment: NEGATIVE
Comment: NEGATIVE
Comment: NORMAL
Neisseria Gonorrhea: NEGATIVE
Trichomonas: NEGATIVE

## 2022-11-21 LAB — 28 WEEK RH+PANEL
Basophils Absolute: 0 10*3/uL (ref 0.0–0.2)
Basos: 0 %
EOS (ABSOLUTE): 0.1 10*3/uL (ref 0.0–0.4)
Eos: 1 %
Gestational Diabetes Screen: 125 mg/dL (ref 70–139)
HIV Screen 4th Generation wRfx: NONREACTIVE
Hematocrit: 34.7 % (ref 34.0–46.6)
Hemoglobin: 11.5 g/dL (ref 11.1–15.9)
Immature Grans (Abs): 0.1 10*3/uL (ref 0.0–0.1)
Immature Granulocytes: 1 %
Lymphocytes Absolute: 1.3 10*3/uL (ref 0.7–3.1)
Lymphs: 17 %
MCH: 31 pg (ref 26.6–33.0)
MCHC: 33.1 g/dL (ref 31.5–35.7)
MCV: 94 fL (ref 79–97)
Monocytes Absolute: 0.4 10*3/uL (ref 0.1–0.9)
Monocytes: 5 %
Neutrophils Absolute: 6 10*3/uL (ref 1.4–7.0)
Neutrophils: 76 %
Platelets: 262 10*3/uL (ref 150–450)
RBC: 3.71 x10E6/uL — ABNORMAL LOW (ref 3.77–5.28)
RDW: 12.7 % (ref 11.7–15.4)
RPR Ser Ql: NONREACTIVE
WBC: 7.8 10*3/uL (ref 3.4–10.8)

## 2022-11-23 MED ORDER — TERCONAZOLE 0.4 % VA CREA: 1.0000 | TOPICAL_CREAM | Freq: Every day | VAGINAL | 0 refills | Status: DC

## 2022-11-24 ENCOUNTER — Encounter: Payer: Self-pay | Admitting: Obstetrics

## 2022-12-04 ENCOUNTER — Ambulatory Visit (INDEPENDENT_AMBULATORY_CARE_PROVIDER_SITE_OTHER): Payer: Medicaid Other | Admitting: Licensed Practical Nurse

## 2022-12-04 ENCOUNTER — Encounter: Payer: Self-pay | Admitting: Licensed Practical Nurse

## 2022-12-04 VITALS — BP 101/66 | HR 119 | Wt 186.3 lb

## 2022-12-04 DIAGNOSIS — Z3A30 30 weeks gestation of pregnancy: Secondary | ICD-10-CM

## 2022-12-04 DIAGNOSIS — Z3480 Encounter for supervision of other normal pregnancy, unspecified trimester: Secondary | ICD-10-CM

## 2022-12-04 DIAGNOSIS — Z3483 Encounter for supervision of other normal pregnancy, third trimester: Secondary | ICD-10-CM

## 2022-12-04 LAB — POCT URINALYSIS DIPSTICK
Bilirubin, UA: NEGATIVE
Blood, UA: NEGATIVE
Glucose, UA: NEGATIVE
Ketones, UA: NEGATIVE
Leukocytes, UA: NEGATIVE
Nitrite, UA: NEGATIVE
Protein, UA: NEGATIVE
Spec Grav, UA: 1.015 (ref 1.010–1.025)
Urobilinogen, UA: 0.2 U/dL
pH, UA: 6.5 (ref 5.0–8.0)

## 2022-12-04 NOTE — Progress Notes (Signed)
Routine Prenatal Care Visit  Subjective  Sarah Martinez is a 22 y.o. [phone] at 103w3d being seen today for ongoing prenatal care.  She is currently monitored for the following issues for this low-risk pregnancy and has Supervision of other normal pregnancy, antepartum; History of preterm delivery, currently pregnant; Herpes infection; and Obesity in pregnancy on their problem list.  ----------------------------------------------------------------------------------- Patient reports fatigue.  Doing well, just feels tired -has a 4 and 23 year old at home, family will watch her children while while she is in labor -her partner will be her labor support, her mother be be there as well -TWG 19lbs, eats over all a balanced diet of proteins, fruits some veggies and carbs, avoid take out and fried foods. Encouraged pt to increase physical activity -Declines blood transfusion for religious reasons, reviewed we do not routinely give blood, often it is necessary when it is given-if it were recommended for her we would have a discussion and honor her decision.  -does not desire any future pregnancies, unsure what she would like to do in regards to contraception. -would like to breastfeed, attempted for short periods of time with her other children, was told she doe snot produce enough. Both of her children were jaundice.  -Sarah Martinez would like her partner to catch  the baby  -uses Sarah Martinez as ped, currently looking for a new provider.   Contractions: Irritability. Vag. Bleeding: None.  Movement: Present. Leaking Fluid denies.  ----------------------------------------------------------------------------------- The following portions of the patient's history were reviewed and updated as appropriate: allergies, current medications, past family history, past medical history, past social history, past surgical history and problem list. Problem list updated.  Objective  Blood pressure 101/66, pulse (!) 119,  weight 186 lb 4.8 oz (84.5 kg). Pregravid weight 167 lb (75.8 kg) Total Weight Gain 19 lb 4.8 oz (8.754 kg) Urinalysis: Urine Protein    Urine Glucose    Fetal Status: Fetal Heart Rate (bpm): 155 Fundal Height: 31 cm Movement: Present     General:  Alert, oriented and cooperative. Patient is in no acute distress.  Skin: Skin is warm and dry. No rash noted.   Cardiovascular: Normal heart rate noted  Respiratory: Normal respiratory effort, no problems with respiration noted  Abdomen: Soft, gravid, appropriate for gestational age. Pain/Pressure: Present     Pelvic:  Cervical exam deferred        Extremities: Normal range of motion.  Edema: Trace  Mental Status: Normal mood and affect. Normal behavior. Normal judgment and thought content.   Assessment   22 y.o. [id number] at [redacted]w[redacted]d by  02/09/2023, by Ultrasound presenting for routine prenatal visit  Plan   G3 Problems (from 10/09/22 to present)     Problem Noted Resolved   Supervision of other normal pregnancy, antepartum 10/09/2022 by Sarah Martinez, CNM No   Overview Addendum 12/04/2022 10:14 AM by Sarah Martinez, CMA     Clinical Staff Provider  Office Location   [facility] Dating  02/09/2023, by Ultrasound  Language  English Anatomy US  7/19-incomplete  Flu Vaccine  declined Genetic Screen  NIPS: low risk XX  TDaP vaccine   declined Hgb A1C or  GTT Early : Third trimester : 125  Covid    LAB RESULTS   Rhogam  O/Positive/-- (07/22 1550)  Blood Type O/Positive/-- (07/22 1550)   Feeding Plan  Antibody Negative (07/22 1550)  Contraception  Rubella <0.90 (07/22 1550)  Circumcision  RPR Non Reactive (07/22 1550)   Pediatrician  HBsAg Negative (07/22 1550)   Support Person  HIV Non Reactive (07/22 1550)  Prenatal Classes  Varicella <135 (07/22 1550)    GBS  (For PCN allergy, check sensitivities)   BTL Consent  Hep C Non Reactive (07/22 1550)   VBAC Consent  Pap No results found for: "DIAGPAP"    Hgb Electro      CF       SMA              History of preterm delivery, currently pregnant 10/09/2022 by Sarah Martinez, CNM No   Overview Signed 10/09/2022  4:54 PM by Sarah Martinez, CNM    G1-spontaneous labor at 36+3, G2-39+           Preterm labor symptoms and general obstetric precautions including but not limited to vaginal bleeding, contractions, leaking of fluid and fetal movement were reviewed in detail with the patient. Please refer to After Visit Summary for other counseling recommendations.   Return in about 2 weeks (around 12/18/2022) for ROB.  Sarah Martinez, CNM   [facility]  12/04/22  11:08 AM

## 2022-12-18 ENCOUNTER — Other Ambulatory Visit: Payer: Self-pay

## 2022-12-18 ENCOUNTER — Ambulatory Visit (INDEPENDENT_AMBULATORY_CARE_PROVIDER_SITE_OTHER): Payer: Medicaid Other | Admitting: Obstetrics

## 2022-12-18 VITALS — BP 108/65 | HR 117 | Wt 186.0 lb

## 2022-12-18 DIAGNOSIS — B009 Herpesviral infection, unspecified: Secondary | ICD-10-CM

## 2022-12-18 DIAGNOSIS — Z3A32 32 weeks gestation of pregnancy: Secondary | ICD-10-CM

## 2022-12-18 DIAGNOSIS — Z3480 Encounter for supervision of other normal pregnancy, unspecified trimester: Secondary | ICD-10-CM

## 2022-12-18 LAB — POCT URINALYSIS DIPSTICK OB
Bilirubin, UA: NEGATIVE
Blood, UA: NEGATIVE
Glucose, UA: NEGATIVE
Ketones, UA: NEGATIVE
Nitrite, UA: NEGATIVE
POC,PROTEIN,UA: NEGATIVE
Urobilinogen, UA: 0.2 U/dL
pH, UA: 7.5 (ref 5.0–8.0)

## 2022-12-18 MED ORDER — TERCONAZOLE 0.4 % VA CREA: 1.0000 | TOPICAL_CREAM | Freq: Every day | VAGINAL | 0 refills | Status: DC

## 2022-12-18 NOTE — Progress Notes (Signed)
Routine Prenatal Care Visit  Subjective  Sarah Martinez is a 22 y.o. [phone] at [redacted]w[redacted]d being seen today for ongoing prenatal care.  She is currently monitored for the following issues for this low-risk pregnancy and has Supervision of other normal pregnancy, antepartum; History of preterm delivery, currently pregnant; Herpes infection; and Obesity in pregnancy on their problem list.  ----------------------------------------------------------------------------------- Patient reports  some right sided sciatic pain. She is declining her tdap, denies signing the consent for blood products and declines the flu shot today. Her chart indicates HX of HSV; she admits that she never took the Valtrex with her last pregnancy at [facility]. Marland Kitchen  She shares belief that she had possible HSV in 2017; but believes that she no longer has it because she received treatment. Contractions: Irritability. Vag. Bleeding: None.  Movement: Present. Leaking Fluid denies.  ----------------------------------------------------------------------------------- The following portions of the patient's history were reviewed and updated as appropriate: allergies, current medications, past family history, past medical history, past social history, past surgical history and problem list. Problem list updated.  Objective  Blood pressure 108/65, pulse (!) 117, weight 186 lb (84.4 kg). Pregravid weight 167 lb (75.8 kg) Total Weight Gain 19 lb (8.618 kg) Urinalysis: Urine Protein Negative  Urine Glucose Negative  Fetal Status: Fetal Heart Rate (bpm): 145 Fundal Height: 33 cm Movement: Present     General:  Alert, oriented and cooperative. Patient is in no acute distress.  Skin: Skin is warm and dry. No rash noted.   Cardiovascular: Normal heart rate noted  Respiratory: Normal respiratory effort, no problems with respiration noted  Abdomen: Soft, gravid, appropriate for gestational age. Pain/Pressure: Present     Pelvic:  Cervical exam  deferred        Extremities: Normal range of motion.  Edema: None  Mental Status: Normal mood and affect. Normal behavior. Normal judgment and thought content.   Assessment   22 y.o. [id number] at [redacted]w[redacted]d by  02/09/2023, by Ultrasound presenting for routine prenatal visit History of HSV per records from [facility]- no culture results found, but she was given Valtrex in last pregnancy- did not take Declines blood products consent Plan   G3 Problems (from 10/09/22 to present)     Problem Noted Resolved   Supervision of other normal pregnancy, antepartum 10/09/2022 by Dominica Severin, CNM No   Overview Addendum 12/18/2022 11:23 AM by Donnetta Hail, CMA     Clinical Staff Provider  Office Location  [facility] Dating  02/09/2023, by Ultrasound  Language  English Anatomy US  7/19-incomplete  Flu Vaccine  Declined Genetic Screen  NIPS: low risk XX  TDaP vaccine  Declined Hgb A1C or  GTT Early : Third trimester : 125  Covid Declined   LAB RESULTS   Rhogam  O/Positive/-- (07/22 1550)  Blood Type O/Positive/-- (07/22 1550)   Feeding Plan Breast Antibody Negative (07/22 1550)  Contraception unsure Rubella <0.90 (07/22 1550)  Circumcision Girl  RPR Non Reactive (07/22 1550)   Pediatrician  Looking for new HBsAg Negative (07/22 1550)   Support Person FOB  HIV Non Reactive (07/22 1550)  Prenatal Classes  Varicella <135 (07/22 1550)    GBS  (For PCN allergy, check sensitivities)   BTL Consent  Hep C Non Reactive (07/22 1550)   VBAC Consent  Pap No results found for: "DIAGPAP"    Hgb Electro      CF      SMA  History of preterm delivery, currently pregnant 10/09/2022 by Dominica Severin, CNM No   Overview Signed 10/09/2022  4:54 PM by Dominica Severin, CNM    G1-spontaneous labor at 36+3, G2-39+           Preterm labor symptoms and general obstetric precautions including but not limited to vaginal bleeding, contractions, leaking of fluid and fetal movement  were reviewed in detail with the patient. Please refer to After Visit Summary for other counseling recommendations.   I carefully reviewed the consent issue with her re blood products. She "used to be" [employer]. Wants the "blood alternative" that is offered to [facility] clients.  Return in about 2 weeks (around 01/01/2023) for return OB.  Mirna Mires, CNM  12/18/2022 3:11 PM

## 2022-12-18 NOTE — Assessment & Plan Note (Signed)
Note from 2017 at [facility] describes "vesicular lesions" at her perineum. Pt states she did not take Valtrex with her last pregnancy as documented at [facility]. Lengthy discussion on 12/17/2012 re risk associated with HSV exposure to her baby.   Redraw HSV bloodwork for her at next appt? In November 2024-

## 2023-01-01 ENCOUNTER — Ambulatory Visit (INDEPENDENT_AMBULATORY_CARE_PROVIDER_SITE_OTHER): Payer: Medicaid Other | Admitting: Obstetrics

## 2023-01-01 VITALS — BP 91/60 | HR 102 | Wt 187.0 lb

## 2023-01-01 DIAGNOSIS — Z3480 Encounter for supervision of other normal pregnancy, unspecified trimester: Secondary | ICD-10-CM

## 2023-01-01 DIAGNOSIS — B009 Herpesviral infection, unspecified: Secondary | ICD-10-CM

## 2023-01-01 DIAGNOSIS — O99210 Obesity complicating pregnancy, unspecified trimester: Secondary | ICD-10-CM

## 2023-01-01 DIAGNOSIS — O99213 Obesity complicating pregnancy, third trimester: Secondary | ICD-10-CM

## 2023-01-01 DIAGNOSIS — O98813 Other maternal infectious and parasitic diseases complicating pregnancy, third trimester: Secondary | ICD-10-CM

## 2023-01-01 DIAGNOSIS — Z3A34 34 weeks gestation of pregnancy: Secondary | ICD-10-CM

## 2023-01-01 DIAGNOSIS — E669 Obesity, unspecified: Secondary | ICD-10-CM

## 2023-01-01 NOTE — Progress Notes (Signed)
    Return Prenatal Note   Subjective  22 y.o. [id number] at [redacted]w[redacted]d presents for this follow-up prenatal visit. Pregnancy notable for BMI, hx of PTD, and HSV.  Patient has no concerns today.   Patient reports: Movement: Present Contractions: Irritability Denies vaginal bleeding or leaking fluid. Objective  Flow sheet Vitals: Pulse Rate: (!) 102 BP: 91/60 Fundal Height: 35 cm Fetal Heart Rate (bpm): 138 Total weight gain: 20 lb (9.072 kg)  General Appearance  No acute distress, well appearing, and well nourished Pulmonary   Normal work of breathing Neurologic   Alert and oriented to person, place, and time Psychiatric   Mood and affect within normal limits  Assessment/Plan   Plan  22 y.o. [id number] at [redacted]w[redacted]d by 18wk [place] presents for follow-up OB visit. Reviewed prenatal record including previous visit note.  1. Supervision of other normal pregnancy, antepartum -Declines RSV vaccine today, aware it will be recommended for infant after delivery.   2. Obesity in pregnancy -Limit further weight gain, stay active, healthy nutrition.  3. HSV (herpes simplex virus) infection - HSV IgG drawn today, discussion with pt regarding need for Valtrex ppx if test shows positive.   Orders Placed This Encounter  Procedures   HSV 1 and 2 Ab, IgG   Return in about 2 weeks (around 01/15/2023) for ROB.   For next visit:  ROB with GBS screening      Julieanne Manson, DO [facility]

## 2023-01-02 LAB — HSV 1 AND 2 AB, IGG
HSV 1 Glycoprotein G Ab, IgG: REACTIVE — AB
HSV 2 IgG, Type Spec: NONREACTIVE

## 2023-01-15 ENCOUNTER — Other Ambulatory Visit (HOSPITAL_COMMUNITY)
Admission: RE | Admit: 2023-01-15 | Discharge: 2023-01-15 | Disposition: A | Discharge status: Home / Self Care | Payer: Medicaid Other | Source: Ambulatory Visit | Attending: Obstetrics | Admitting: Obstetrics

## 2023-01-15 ENCOUNTER — Ambulatory Visit (INDEPENDENT_AMBULATORY_CARE_PROVIDER_SITE_OTHER): Payer: Medicaid Other | Admitting: Obstetrics

## 2023-01-15 VITALS — BP 110/61 | HR 114 | Wt 190.5 lb

## 2023-01-15 DIAGNOSIS — Z3480 Encounter for supervision of other normal pregnancy, unspecified trimester: Secondary | ICD-10-CM | POA: Insufficient documentation

## 2023-01-15 DIAGNOSIS — Z113 Encounter for screening for infections with a predominantly sexual mode of transmission: Secondary | ICD-10-CM

## 2023-01-15 DIAGNOSIS — Z3A36 36 weeks gestation of pregnancy: Secondary | ICD-10-CM | POA: Diagnosis present

## 2023-01-15 DIAGNOSIS — B009 Herpesviral infection, unspecified: Secondary | ICD-10-CM

## 2023-01-15 LAB — POCT URINALYSIS DIPSTICK OB
Bilirubin, UA: NEGATIVE
Blood, UA: NEGATIVE
Glucose, UA: NEGATIVE
Ketones, UA: NEGATIVE
Leukocytes, UA: NEGATIVE
Nitrite, UA: NEGATIVE
Spec Grav, UA: 1.02 (ref 1.010–1.025)
Urobilinogen, UA: 0.2 U/dL
pH, UA: 6 (ref 5.0–8.0)

## 2023-01-15 MED ORDER — VALACYCLOVIR HCL 500 MG PO TABS: 500.0000 mg | ORAL_TABLET | Freq: Two times a day (BID) | ORAL | 6 refills | Status: DC

## 2023-01-15 NOTE — Assessment & Plan Note (Signed)
-  GBS and GC/chlamydia swabs today  -Cephalic by [facility] -Reviewed s/s of PTL and when to go to the hospital

## 2023-01-15 NOTE — Progress Notes (Signed)
    Return Prenatal Note   Assessment/Plan   Plan  22 y.o. [id number] at [redacted]w[redacted]d presents for follow-up OB visit. Reviewed prenatal record including previous visit note.  Herpes infection -Rx sent for valacyclovir 500 mg BID  Supervision of other normal pregnancy, antepartum -GBS and GC/chlamydia swabs today  -Cephalic by [facility] -Reviewed s/s of PTL and when to go to the hospital   Orders Placed This Encounter  Procedures   Culture, beta strep (group b only)   POC Urinalysis Dipstick OB   Return in about 1 week (around 01/22/2023).   Future Appointments  Date Time Provider Department Center  01/23/2023 10:15 AM Tresea Mall, CNM [facility] None    For next visit:  continue with routine prenatal care     Subjective   Sarah Martinez is having some runs of contractions but nothing regular.  She reports that she was told the baby was breech at her prior appointment. HSV1 test is positive; she does confirm h/o genital outbreak.    Movement: Present Contractions: Regular  Objective   Flow sheet Vitals: Pulse Rate: (!) 114 BP: 110/61 Fundal Height: 36 cm Fetal Heart Rate (bpm): 155 Total weight gain: 23 lb 8 oz (10.7 kg)  General Appearance  No acute distress, well appearing, and well nourished Pulmonary   Normal work of breathing Neurologic   Alert and oriented to person, place, and time Psychiatric   Mood and affect within normal limits  Sarah Martinez Spanish, CNM 01/15/23 10:32 AM

## 2023-01-15 NOTE — Progress Notes (Signed)
ROB [redacted]w[redacted]d: She is doing well, has good fetal movement. She has concerns about swelling and numbness in her hands and feet. Vaginal cultures are done today.

## 2023-01-15 NOTE — Assessment & Plan Note (Signed)
-  Rx sent for valacyclovir 500 mg BID

## 2023-01-16 LAB — CERVICOVAGINAL ANCILLARY ONLY
Chlamydia: NEGATIVE
Comment: NEGATIVE
Comment: NORMAL
Neisseria Gonorrhea: NEGATIVE

## 2023-01-18 LAB — CULTURE, BETA STREP (GROUP B ONLY): Strep Gp B Culture: NEGATIVE

## 2023-01-23 ENCOUNTER — Other Ambulatory Visit (HOSPITAL_COMMUNITY)
Admission: RE | Admit: 2023-01-23 | Discharge: 2023-01-23 | Disposition: A | Discharge status: Home / Self Care | Payer: Medicaid Other | Source: Ambulatory Visit | Attending: Advanced Practice Midwife | Admitting: Advanced Practice Midwife

## 2023-01-23 ENCOUNTER — Ambulatory Visit (INDEPENDENT_AMBULATORY_CARE_PROVIDER_SITE_OTHER): Payer: Medicaid Other | Admitting: Advanced Practice Midwife

## 2023-01-23 VITALS — BP 105/67 | HR 116 | Wt 190.8 lb

## 2023-01-23 DIAGNOSIS — N898 Other specified noninflammatory disorders of vagina: Secondary | ICD-10-CM

## 2023-01-23 DIAGNOSIS — O26893 Other specified pregnancy related conditions, third trimester: Secondary | ICD-10-CM | POA: Diagnosis present

## 2023-01-23 DIAGNOSIS — Z3A37 37 weeks gestation of pregnancy: Secondary | ICD-10-CM

## 2023-01-23 DIAGNOSIS — Z3483 Encounter for supervision of other normal pregnancy, third trimester: Secondary | ICD-10-CM

## 2023-01-23 LAB — POCT URINALYSIS DIPSTICK
Bilirubin, UA: NEGATIVE
Blood, UA: NEGATIVE
Glucose, UA: NEGATIVE
Ketones, UA: NEGATIVE
Leukocytes, UA: NEGATIVE
Nitrite, UA: NEGATIVE
Protein, UA: NEGATIVE
Spec Grav, UA: 1.02 (ref 1.010–1.025)
Urobilinogen, UA: 0.2 U/dL
pH, UA: 7.5 (ref 5.0–8.0)

## 2023-01-23 NOTE — Progress Notes (Signed)
Routine Prenatal Care Visit  Subjective  Sarah Martinez is a 22 y.o. [phone] at [redacted]w[redacted]d being seen today for ongoing prenatal care.  She is currently monitored for the following issues for this low-risk pregnancy and has Supervision of other normal pregnancy, antepartum; History of preterm delivery, currently pregnant; Herpes infection; and Obesity in pregnancy on their problem list.  ----------------------------------------------------------------------------------- Patient reports mild vaginal itching and discharge. Monistat 3, apple cider vinegar soak recommended. She also has complaint of her hands feeling numb. Recommend sleep with hands elevated, range of motion exercises. G2 was 8#14oz. She feels like this baby is smaller.  Contractions: Not present. Vag. Bleeding: None.  Movement: Present. Leaking Fluid denies.  ----------------------------------------------------------------------------------- The following portions of the patient's history were reviewed and updated as appropriate: allergies, current medications, past family history, past medical history, past social history, past surgical history and problem list. Problem list updated.  Objective  Blood pressure 105/67, pulse (!) 116, weight 190 lb 12.8 oz (86.5 kg). Pregravid weight 167 lb (75.8 kg) Total Weight Gain 23 lb 12.8 oz (10.8 kg) Urinalysis: Urine Protein    Urine Glucose    Fetal Status: Fetal Heart Rate (bpm): 145 Fundal Height: 39 cm Movement: Present    Vertex by [place]olds  General:  Alert, oriented and cooperative. Patient is in no acute distress.  Skin: Skin is warm and dry. No rash noted.   Cardiovascular: Normal heart rate noted  Respiratory: Normal respiratory effort, no problems with respiration noted  Abdomen: Soft, gravid, appropriate for gestational age. Pain/Pressure: Absent     Pelvic:  Cervical exam deferred        Extremities: Normal range of motion.  Edema: Trace  Mental Status: Normal mood and  affect. Normal behavior. Normal judgment and thought content.   Assessment   22 y.o. [id number] at [redacted]w[redacted]d by  02/09/2023, by Ultrasound presenting for routine prenatal visit  Plan   G3 Problems (from 10/09/22 to present)     Problem Noted Resolved   Supervision of other normal pregnancy, antepartum 10/09/2022 by Dominica Severin, CNM No   Overview Addendum 01/01/2023 10:32 AM by Julieanne Manson, MD     Clinical Staff Provider  Office Location  [facility] Dating  02/09/2023, by Ultrasound  Language  English Anatomy US  7/19-incomplete  Flu Vaccine  Declined Genetic Screen  NIPS: low risk XX  TDaP vaccine  Declined Hgb A1C or  GTT Early : Third trimester : 125  Covid Declined   LAB RESULTS   Rhogam  O/Positive/-- (07/22 1550)  Blood Type O/Positive/-- (07/22 1550)   Feeding Plan Breast Antibody Negative (07/22 1550)  Contraception unsure Rubella <0.90 (07/22 1550)  Circumcision Girl  RPR Non Reactive (07/22 1550)   Pediatrician  Looking for new HBsAg Negative (07/22 1550)   Support Person FOB  HIV Non Reactive (07/22 1550)  Prenatal Classes  Varicella <135 (07/22 1550)    GBS  (For PCN allergy, check sensitivities)   BTL Consent  Hep C Non Reactive (07/22 1550)   VBAC Consent  Pap No results found for: "DIAGPAP"    Hgb Electro    RSV Vaccine Declined 11/11 CF      SMA              History of preterm delivery, currently pregnant 10/09/2022 by Dominica Severin, CNM No   Overview Signed 10/09/2022  4:54 PM by Dominica Severin, CNM    G1-spontaneous labor at 36+3, G2-39+  Term labor symptoms and general obstetric precautions including but not limited to vaginal bleeding, contractions, leaking of fluid and fetal movement were reviewed in detail with the patient. Please refer to After Visit Summary for other counseling recommendations. Vaginal itching: aptima swab collected, OTC monistat treatment, follow up as needed   Return in about 1 week (around  01/30/2023) for rob.  Tresea Mall, CNM 01/23/2023 10:44 AM

## 2023-01-24 LAB — CERVICOVAGINAL ANCILLARY ONLY
Bacterial Vaginitis (gardnerella): NEGATIVE
Candida Glabrata: NEGATIVE
Candida Vaginitis: POSITIVE — AB
Comment: NEGATIVE
Comment: NEGATIVE
Comment: NEGATIVE

## 2023-01-25 ENCOUNTER — Other Ambulatory Visit: Payer: Self-pay | Admitting: Obstetrics

## 2023-01-29 NOTE — Telephone Encounter (Signed)
Recommends monistat 3 day

## 2023-01-30 ENCOUNTER — Ambulatory Visit (INDEPENDENT_AMBULATORY_CARE_PROVIDER_SITE_OTHER): Payer: Medicaid Other | Admitting: Certified Nurse Midwife

## 2023-01-30 VITALS — BP 98/65 | HR 111 | Wt 191.0 lb

## 2023-01-30 DIAGNOSIS — Z3A38 38 weeks gestation of pregnancy: Secondary | ICD-10-CM

## 2023-01-30 DIAGNOSIS — Z3483 Encounter for supervision of other normal pregnancy, third trimester: Secondary | ICD-10-CM

## 2023-01-30 LAB — POCT URINALYSIS DIPSTICK OB
Bilirubin, UA: NEGATIVE
Blood, UA: NEGATIVE
Glucose, UA: NEGATIVE
Ketones, UA: NEGATIVE
Leukocytes, UA: NEGATIVE
Nitrite, UA: NEGATIVE
POC,PROTEIN,UA: NEGATIVE
Spec Grav, UA: 1.02 (ref 1.010–1.025)
Urobilinogen, UA: 0.2 U/dL
pH, UA: 5 (ref 5.0–8.0)

## 2023-01-30 NOTE — Patient Instructions (Signed)
.  Braxton Hicks Contractions  Contractions of the uterus can occur throughout pregnancy, but they are not always a sign that you are in labor. You may have practice contractions called Braxton Hicks contractions. These false labor contractions are sometimes confused with true labor. What are Braxton Hicks contractions? Braxton Hicks contractions are tightening movements that occur in the muscles of the uterus before labor. Unlike true labor contractions, these contractions do not result in opening (dilation) and thinning of the lowest part of the uterus (cervix). Toward the end of pregnancy (32-34 weeks), Braxton Hicks contractions can happen more often and may become stronger. These contractions are sometimes difficult to tell apart from true labor because they can be very uncomfortable. How to tell the difference between true labor and false labor True labor Contractions last 30-70 seconds. Contractions become very regular. Discomfort is usually felt in the top of the uterus, and it spreads to the lower abdomen and low back. Contractions do not go away with walking. Contractions usually become stronger and more frequent. The cervix dilates and gets thinner. False labor Contractions are usually shorter, weaker, and farther apart than true labor contractions. Contractions are usually irregular. Contractions are often felt in the front of the lower abdomen and in the groin. Contractions may go away when you walk around or change positions while lying down. The cervix usually does not dilate or become thin. Sometimes, the only way to tell if you are in true labor is for your health care provider to look for changes in your cervix. Your health care provider will do a physical exam and may monitor your contractions. If you are in true labor, your health care provider will send you home with instructions about when to return to the hospital. You may continue to have Braxton Hicks contractions until  you go into true labor. Follow these instructions at home:  Take over-the-counter and prescription medicines only as told by your health care provider. If Braxton Hicks contractions are making you uncomfortable: Change your position from lying down or resting to walking, or change from walking to resting. Sit and rest in a tub of warm water. Drink enough fluid to keep your urine pale yellow. Dehydration may cause these contractions. Do slow and deep breathing several times an hour. Keep all follow-up visits. This is important. Contact a health care provider if: You have a fever. You have continuous pain in your abdomen. Your contractions become stronger, more regular, and closer together. You pass blood-tinged mucus. Get help right away if: You have fluid leaking or gushing from your vagina. You have bright red blood coming from your vagina. Your baby is not moving inside you as much as it used to. Summary You may have practice contractions called Braxton Hicks contractions. These false labor contractions are sometimes confused with true labor. Braxton Hicks contractions are usually shorter, weaker, farther apart, and less regular than true labor contractions. True labor contractions usually become stronger, more regular, and more frequent. Manage discomfort from Braxton Hicks contractions by changing position, resting in a warm bath, practicing deep breathing, and drinking plenty of water. Keep all follow-up visits. Contact your health care provider if your contractions become stronger, more regular, and closer together. This information is not intended to replace advice given to you by your health care provider. Make sure you discuss any questions you have with your health care provider. Document Revised: 12/15/2019 Document Reviewed: 12/15/2019 Elsevier Patient Education  2024 [employer] Inc[employer]  

## 2023-01-30 NOTE — Progress Notes (Signed)
ROB doing well, feeling good movement. No concerns today. Discussed labor precautions. SVE per pt request 3/50/-2. Vertex.   Follow up 1 wk or prn .   Doreene Burke, CNM

## 2023-02-06 ENCOUNTER — Encounter: Payer: Self-pay | Admitting: Certified Nurse Midwife

## 2023-02-06 ENCOUNTER — Encounter: Payer: Medicaid Other | Admitting: Licensed Practical Nurse

## 2023-02-06 ENCOUNTER — Inpatient Hospital Stay
Admission: EM | Admit: 2023-02-06 | Discharge: 2023-02-07 | DRG: 806 | Disposition: A | Discharge status: Home / Self Care | Payer: Medicaid Other | Attending: Certified Nurse Midwife | Admitting: Certified Nurse Midwife

## 2023-02-06 ENCOUNTER — Other Ambulatory Visit: Payer: Self-pay

## 2023-02-06 DIAGNOSIS — O26893 Other specified pregnancy related conditions, third trimester: Secondary | ICD-10-CM | POA: Diagnosis present

## 2023-02-06 DIAGNOSIS — O9832 Other infections with a predominantly sexual mode of transmission complicating childbirth: Secondary | ICD-10-CM | POA: Diagnosis present

## 2023-02-06 DIAGNOSIS — O479 False labor, unspecified: Principal | ICD-10-CM | POA: Diagnosis present

## 2023-02-06 DIAGNOSIS — Z531 Procedure and treatment not carried out because of patient's decision for reasons of belief and group pressure: Secondary | ICD-10-CM

## 2023-02-06 DIAGNOSIS — Z3483 Encounter for supervision of other normal pregnancy, third trimester: Secondary | ICD-10-CM

## 2023-02-06 DIAGNOSIS — B009 Herpesviral infection, unspecified: Secondary | ICD-10-CM

## 2023-02-06 DIAGNOSIS — Z3A39 39 weeks gestation of pregnancy: Secondary | ICD-10-CM | POA: Diagnosis not present

## 2023-02-06 DIAGNOSIS — O9882 Other maternal infectious and parasitic diseases complicating childbirth: Secondary | ICD-10-CM

## 2023-02-06 DIAGNOSIS — A6000 Herpesviral infection of urogenital system, unspecified: Secondary | ICD-10-CM | POA: Diagnosis present

## 2023-02-06 HISTORY — DX: False labor, unspecified: O47.9

## 2023-02-06 LAB — CBC
HCT: 36.8 % (ref 36.0–46.0)
Hemoglobin: 12.4 g/dL (ref 12.0–15.0)
MCH: 30.7 pg (ref 26.0–34.0)
MCHC: 33.7 g/dL (ref 30.0–36.0)
MCV: 91.1 fL (ref 80.0–100.0)
Platelets: 234 10*3/uL (ref 150–400)
RBC: 4.04 MIL/uL (ref 3.87–5.11)
RDW: 14 % (ref 11.5–15.5)
WBC: 16 10*3/uL — ABNORMAL HIGH (ref 4.0–10.5)
nRBC: 0 % (ref 0.0–0.2)

## 2023-02-06 LAB — TYPE AND SCREEN
ABO/RH(D): O POS
Antibody Screen: NEGATIVE

## 2023-02-06 LAB — RPR: RPR Ser Ql: NONREACTIVE

## 2023-02-06 MED ORDER — PRENATAL MULTIVITAMIN CH
1.0000 | ORAL_TABLET | Freq: Every day | ORAL | Status: DC
  Administered 2023-02-06: 1 via ORAL
  Filled 2023-02-06: qty 1

## 2023-02-06 MED ORDER — MEASLES, MUMPS & RUBELLA VAC IJ SOLR: 0.5000 mL | INTRAMUSCULAR | Status: DC | PRN

## 2023-02-06 MED ORDER — OXYTOCIN 10 UNIT/ML IJ SOLN
INTRAMUSCULAR | Status: AC
  Filled 2023-02-06: qty 2

## 2023-02-06 MED ORDER — DIBUCAINE (PERIANAL) 1 % EX OINT: 1.0000 | TOPICAL_OINTMENT | CUTANEOUS | Status: DC | PRN

## 2023-02-06 MED ORDER — OXYTOCIN 10 UNIT/ML IJ SOLN: 10.0000 [IU] | Freq: Once | INTRAMUSCULAR | Status: DC

## 2023-02-06 MED ORDER — IBUPROFEN 600 MG PO TABS
600.0000 mg | ORAL_TABLET | Freq: Four times a day (QID) | ORAL | Status: DC
  Administered 2023-02-06 – 2023-02-07 (×3): 600 mg via ORAL
  Filled 2023-02-06 (×4): qty 1

## 2023-02-06 MED ORDER — ZOLPIDEM TARTRATE 5 MG PO TABS: 5.0000 mg | ORAL_TABLET | Freq: Every evening | ORAL | Status: DC | PRN

## 2023-02-06 MED ORDER — WITCH HAZEL-GLYCERIN EX PADS: 1.0000 | MEDICATED_PAD | CUTANEOUS | Status: DC | PRN

## 2023-02-06 MED ORDER — COCONUT OIL OIL
1.0000 | TOPICAL_OIL | Status: DC | PRN
  Filled 2023-02-06: qty 15

## 2023-02-06 MED ORDER — ONDANSETRON HCL 4 MG PO TABS
4.0000 mg | ORAL_TABLET | ORAL | Status: DC | PRN
Start: 2023-02-06 — End: 2023-02-07

## 2023-02-06 MED ORDER — DOCUSATE SODIUM 100 MG PO CAPS
100.0000 mg | ORAL_CAPSULE | Freq: Two times a day (BID) | ORAL | Status: DC
  Filled 2023-02-06: qty 1

## 2023-02-06 MED ORDER — ONDANSETRON HCL 4 MG/2ML IJ SOLN: 4.0000 mg | INTRAMUSCULAR | Status: DC | PRN

## 2023-02-06 MED ORDER — BENZOCAINE-MENTHOL 20-0.5 % EX AERO
1.0000 | INHALATION_SPRAY | CUTANEOUS | Status: DC | PRN
  Filled 2023-02-06: qty 56

## 2023-02-06 MED ORDER — PRENATAL MULTIVITAMIN CH: 1.0000 | ORAL_TABLET | Freq: Every day | ORAL | Status: DC

## 2023-02-06 MED ORDER — DIPHENHYDRAMINE HCL 25 MG PO CAPS: 25.0000 mg | ORAL_CAPSULE | Freq: Four times a day (QID) | ORAL | Status: DC | PRN

## 2023-02-06 MED ORDER — TETANUS-DIPHTH-ACELL PERTUSSIS 5-2.5-18.5 LF-MCG/0.5 IM SUSY: 0.5000 mL | PREFILLED_SYRINGE | INTRAMUSCULAR | Status: DC | PRN

## 2023-02-06 MED ORDER — ACETAMINOPHEN 500 MG PO TABS
1000.0000 mg | ORAL_TABLET | Freq: Four times a day (QID) | ORAL | Status: DC | PRN
  Administered 2023-02-06 (×2): 1000 mg via ORAL
  Filled 2023-02-06 (×3): qty 2

## 2023-02-06 MED ORDER — SIMETHICONE 80 MG PO CHEW: 80.0000 mg | CHEWABLE_TABLET | ORAL | Status: DC | PRN

## 2023-02-06 MED ORDER — MAGNESIUM HYDROXIDE 400 MG/5ML PO SUSP: 30.0000 mL | ORAL | Status: DC | PRN

## 2023-02-06 MED ORDER — FERROUS SULFATE 325 (65 FE) MG PO TABS
325.0000 mg | ORAL_TABLET | Freq: Every day | ORAL | Status: DC
  Administered 2023-02-06 – 2023-02-07 (×2): 325 mg via ORAL
  Filled 2023-02-06 (×2): qty 1

## 2023-02-06 MED ORDER — VARICELLA VIRUS VACCINE LIVE 1350 PFU/0.5ML IJ SUSR: 0.5000 mL | INTRAMUSCULAR | Status: DC | PRN

## 2023-02-06 NOTE — Lactation Note (Signed)
This note was copied from a baby's chart. Lactation Consultation Note  Patient Name: Girl Sarah Martinez [id number] Date: 02/06/2023 Age:23 hours Reason for consult: Initial assessment;1st time breastfeeding;Term;Breastfeeding assistance   Maternal Data This is mom's 3rd baby, precipitous spontaneous vaginal delivery. Mom with history of obesity and + cannabinoid 7/24(on admission mom and baby UDS is negative )  On first visit mom reports baby is sleepy and has not awakened to breastfeed in 5 hours despite mom attempting to change the diaper and awaken baby. LC assisted mom  with breastfeeding. Has patient been taught Hand Expression?: Yes Does the patient have breastfeeding experience prior to this delivery?: No (Mom reports she did not breastfeed her previous children. Per mom she would like to breastfeed this baby.)  Feeding Mother's Current Feeding Choice: Breast Milk Provided mom with tips and strategies on how to wake a sleepy baby. Recommended mom unswaddle the baby. Demonstrated tactile stimulation/ infant massage with mom's permission. Baby readily awakened showing feeding cues. Assisted mom with maximizing position and latch techniques. Baby readily latched, mom with discomfort due to baby's lips rolled inward. Instructed mom on how to correct latch. Mom was able to independently correct latch. Mom could recognize swallows. Baby was content after feeding. LATCH Score Latch: Repeated attempts needed to sustain latch, nipple held in mouth throughout feeding, stimulation needed to elicit sucking reflex. (Once mom understood how to encourage baby to open mouth wide mom was able to independently latch baby. Baby with lips rolled inward. Provided mom strategy to correct upper and lower lip latch to roll baby's lips outward on to the areola.)  Audible Swallowing: Spontaneous and intermittent  Type of Nipple: Everted at rest and after stimulation  Comfort (Breast/Nipple): Soft /  non-tender  Hold (Positioning): Assistance needed to correctly position infant at breast and maintain latch.  LATCH Score: 8   Interventions Interventions: Breast feeding basics reviewed;Assisted with latch;Breast massage;Hand express;Breast compression;Adjust position;Support pillows;Position options;Education  Discharge Pump:  (Mom does not have a pump for home use. Mom would like manual pump for home use. LC brought manual pump to room. At this time older sibling in room to meet new baby sister. Mom requests to get instruction on the manual pump tomorrow.)  Consult Status Consult Status: Follow-up  Update provided to care nurse.  Fuller Song 02/06/2023, 6:33 PM

## 2023-02-06 NOTE — OB Triage Note (Signed)
Sarah Martinez 22 y.o. @G3P2  @39w4dGA   presents to Labor & Delivery triage via wheelchair steered by ED staff reporting Contractions and leaking fluid for 3 days. .She denies contractions and states positive fetal movement. External FM and TOCO applied to non-tender abdomen. Initial FHR 145. Vital signs obtained and within normal limits. Patient oriented to care environment including call bell and bed control use. Hartley Barefoot, CNM notified of patient's arrival.

## 2023-02-06 NOTE — Progress Notes (Signed)
Post Partum Day 0 Subjective: Sarah Martinez is approximately 7 hours PP. She is feeling well overall. She has not yet attempted to void. Her pain is well-controlled and her bleeding is WNL. Her mood is stable. She plans to breastfeed but baby has been sleepy.   Objective: Blood pressure (!) 97/57, pulse 90, temperature 98.3 F (36.8 C), temperature source Oral, resp. rate 18, height 5\' 1"  (1.549 m), weight 86.2 kg, SpO2 100%, unknown if currently breastfeeding.  Physical Exam:  General: alert, cooperative, and appears stated age Lochia: appropriate Uterine Fundus: firm Incision: N/A DVT Evaluation: No evidence of DVT seen on physical exam.  Recent Labs    02/06/23 0257  HGB 12.4  HCT 36.8    Assessment/Plan: Plan for discharge tomorrow Lactation assistance as needed Routine postpartum care   LOS: 0 days   Glenetta Borg, CNM 02/06/2023, 9:06 AM

## 2023-02-06 NOTE — TOC CM/SW Note (Signed)
CSW received consult for "drug exposed newborn"  Referral was screened out due to the following: ~MOB had no documented substance use after initial prenatal visit/+UPT. ~MOB had no positive drug screens after initial prenatal visit/+UPT. ~Baby's UDS is negative.  Please consult CSW if current concerns arise or by MOB's request.  CSW will monitor CDS results and make report to Child Protective Services if warranted.  Alfonso Ramus, LCSW Transitions of Care Department [phone]

## 2023-02-06 NOTE — Discharge Summary (Signed)
Postpartum Discharge Summary     Patient Name: Sarah Martinez DOB: 2000-09-06 MRN: [id number]  Date of admission: 02/06/2023 Delivery date:02/06/2023 Delivering provider: Hartley Barefoot L Date of discharge: 02/07/2023  Admitting diagnosis: Uterine contractions [O47.9] Intrauterine pregnancy: [redacted]w[redacted]d     Secondary diagnosis:  Active Problems:   Labor, precipitous, delivered   Postpartum care following vaginal delivery   Transfusion of blood product refused for religious reason  Additional problems: none    Discharge diagnosis: Term Pregnancy Delivered                                              Post partum procedures: declined vaccination Augmentation: N/A Complications: None  Hospital course: Onset of Labor With Vaginal Delivery      22 y.o. yo [id number] at [redacted]w[redacted]d was admitted in Active Labor on 02/06/2023. Labor course was complicated by precipitous labor  Membrane Rupture Time/Date: 1:49 AM,02/04/2023  Delivery Method:Vaginal, Spontaneous Operative Delivery:N/A Episiotomy: None Lacerations:    Patient had an uncomplicated postpartum course.  She is ambulating, tolerating a regular diet, passing flatus, and urinating well. Patient is discharged home in stable condition on 02/07/23.  Newborn Data: Birth date:02/06/2023 Birth time:1:49 AM Gender:Female Living status:Living Apgars:9 ,9  Weight:3760 g  Magnesium Sulfate received: No BMZ received: No Rhophylac:N/A MMR: Rubella non-immune, vaccines declined T-DaP: declined Flu: declined RSV Vaccine received: declined Transfusion:No Immunizations administered: There is no immunization history for the selected administration types on file for this patient.  Physical exam  Vitals:   02/06/23 1149 02/06/23 1542 02/06/23 2350 02/07/23 0757  BP: 105/69 108/78 112/66 103/67  Pulse: (!) 101 (!) 102 94 95  Resp: 18 18 17 18   Temp: 98.7 F (37.1 C) 99.4 F (37.4 C) 98.3 F (36.8 C) 98.3 F (36.8 C)   TempSrc: Oral Oral Oral Oral  SpO2: 100% 99% 96% 98%  Weight:      Height:       General: alert, cooperative, and no distress Lochia: appropriate Uterine Fundus: firm Incision: N/A DVT Evaluation: No evidence of DVT seen on physical exam. Labs: Lab Results  Component Value Date   WBC 10.7 (H) 02/07/2023   HGB 11.7 (L) 02/07/2023   HCT 34.4 (L) 02/07/2023   MCV 90.1 02/07/2023   PLT 217 02/07/2023      Latest Ref Rng & Units 06/14/2015   12:22 PM  CMP  Glucose 65 - 99 mg/dL 147   BUN 6 - 20 mg/dL 8   Creatinine 8.29 - 5.62 mg/dL 1.30   Sodium 865 - 784 mmol/L 138   Potassium 3.5 - 5.1 mmol/L 4.1   Chloride 101 - 111 mmol/L 106   CO2 22 - 32 mmol/L 24   Calcium 8.9 - 10.3 mg/dL 9.2   Total Protein 6.5 - 8.1 g/dL 8.0   Total Bilirubin 0.3 - 1.2 mg/dL 0.8   Alkaline Phos 50 - 162 U/L 59   AST 15 - 41 U/L 18   ALT 14 - 54 U/L 12    Edinburgh Score:    02/06/2023    9:17 AM  Edinburgh Postnatal Depression Scale Screening Tool  I have been able to laugh and see the funny side of things. 0  I have looked forward with enjoyment to things. 0  I have blamed myself unnecessarily when things went wrong. 0  I have been  anxious or worried for no good reason. 0  I have felt scared or panicky for no good reason. 0  Things have been getting on top of me. 1  I have been so unhappy that I have had difficulty sleeping. 0  I have felt sad or miserable. 0  I have been so unhappy that I have been crying. 0  The thought of harming myself has occurred to me. 0  Edinburgh Postnatal Depression Scale Total 1      After visit meds:  Allergies as of 02/07/2023   No Known Allergies      Medication List     STOP taking these medications    terconazole 0.4 % vaginal cream Commonly known as: TERAZOL 7   valACYclovir 500 MG tablet Commonly known as: VALTREX       TAKE these medications    acetaminophen 500 MG tablet Commonly known as: TYLENOL Take 2 tablets (1,000 mg  total) by mouth every 6 (six) hours as needed (for pain scale < 4).   ibuprofen 600 MG tablet Commonly known as: ADVIL Take 1 tablet (600 mg total) by mouth every 6 (six) hours.   Prenatal Plus Vitamin/Mineral 27-1 MG Tabs Take 1 tablet by mouth daily.         Discharge home in stable condition Infant Feeding: Bottle and Breast Infant Disposition:home with mother Discharge instruction: per After Visit Summary and Postpartum booklet. Activity: Advance as tolerated. Pelvic rest for 6 weeks.  Diet: routine diet Anticipated Birth Control: Unsure Postpartum Appointment:6 weeks Additional Postpartum F/U: Postpartum Depression checkup Future Appointments: No future appointments.  Follow up Visit:  Follow-up Information     [facility]. Schedule an appointment as soon as possible for a visit in 6 week(s).   Specialty: Obstetrics and Gynecology Why: Postpartum visit Contact information: [address] [place] [address] [phone]        Dominica Severin, CNM. Schedule an appointment as soon as possible for a visit in 2 week(s).   Specialty: Certified Nurse Midwife Why: virtual postpartum visit Contact information: [address] [place] [address] [phone]                     02/07/2023 Lindalou Hose Kaleel Schmieder, CNM

## 2023-02-06 NOTE — H&P (Signed)
[facility] Labor & Delivery  History and Physical   HPI   Chief Complaint: contractions  Sarah Martinez Sarah Martinez is a 22 y.o. [phone] at [redacted]w[redacted]d who presents for contractions & leaking fluid. Contractions woke her from sleep, she reports increased clear vaginal discharge for the last three days, denies large gush. Difficulty coping with contractions.    Pregnancy Complications Patient Active Problem List   Diagnosis Date Noted   Uterine contractions 02/06/2023   Herpes infection 11/06/2022   Obesity in pregnancy 11/06/2022   Supervision of other normal pregnancy, antepartum 10/09/2022   History of preterm delivery, currently pregnant 10/09/2022    Review of Systems A twelve point review of systems was negative except as stated in HPI.   HISTORY   Medications Medications Prior to Admission  Medication Sig Dispense Refill Last Dose/Taking   Prenatal Vit-Fe Fumarate-FA (PRENATAL PLUS VITAMIN/MINERAL) 27-1 MG TABS Take 1 tablet by mouth daily. 90 tablet 3    terconazole (TERAZOL 7) 0.4 % vaginal cream Place 1 applicator vaginally at bedtime. Use for seven days (Patient not taking: Reported on 01/01/2023) 45 g 0    valACYclovir (VALTREX) 500 MG tablet Take 1 tablet (500 mg total) by mouth 2 (two) times daily. 60 tablet 6     Allergies has no known allergies.   OB History OB History  Gravida Para Term Preterm AB Living  3 2 1 1  0 2  SAB IAB Ectopic Multiple Live Births  0 0 0 0 2    # Outcome Date GA Lbr Len/2nd Weight Sex Type Anes PTL Lv  3 Current           2 Term 08/15/20 [redacted]w[redacted]d 04:39 / 03:52 4020 g F Vag-Spont EPI  LIV     Name: Sarah Martinez     Apgar1: 8  Apgar5: 9  1 Preterm 04/05/15 [redacted]w[redacted]d  3147 g M Vag-Spont  Y LIV    Past Medical History No past medical history on file.  Past Surgical History No past surgical history on file.  Social History  reports that she has never smoked. She has never used smokeless tobacco. She  reports that she does not drink alcohol and does not use drugs.   Family History family history is not on file.   PHYSICAL EXAM   There were no vitals filed for this visit.  Constitutional: No acute distress, well appearing, and well nourished. Neurologic: She is alert and conversational.  Psychiatric: She has a normal mood and affect.  Musculoskeletal: Normal gait, grossly normal range of motion Cardiovascular: Normal rate.   Pulmonary/Chest: Normal work of breathing.  Gastrointestinal/Abdominal: Soft. Gravid. There is no tenderness.  Skin: Skin is warm and dry. No rash noted.  Genitourinary: Normal external female genitalia.  SVE:  per RN unable to check due to patient discomfort     NST Interpretation Indication: contractions Baseline: 140 bpm Variability: moderate Accelerations: none noted, strip broken due to patient movement Decelerations: none Contractions: regular, every 2-3 minutes Time noted:  See OBIX Impression: equivocal, less than 63m on EFM prior to delivery Authenticated by: Sarah Martinez       ASSESSMENT AND PLAN   Sarah Martinez Sarah Martinez is a 22 y.o. [id number] at [redacted]w[redacted]d with EDD: 02/09/2023, by Ultrasound admitted for labor management.  Expectant Management  Fetal Status: - cephalic presentation by BSUS at last prenatal visit - EFW: 8lb by [place] - CEFM  Declines all blood products - active management of third stage  of labor  HSV - on valtrex, no lesions noted on exam, SSE deferred given advanced dilation  Labs/Immunizations: TDAP: declined Flu: declined Rubella: non-immune Varicella non-immune HIV: NR Hep B: Negative Hep C: NR RPR: NR GBS: negative  Lab Results  Component Value Date   VZVIGG <135 (L) 09/11/2022   HIV Non Reactive 11/20/2022   HIV Non-reactive 05/26/2020     Postpartum Plan: - Feeding: Breast - Contraception: plans undecided - Prenatal Care Provider: [facility]  Attending Dr. Lonny Martinez was immediately available  for the care of the patient.     Future Appointments  Date Time Provider Department Center  02/06/2023 10:15 AM Sarah Martinez, CNM [facility] None

## 2023-02-07 LAB — CBC
HCT: 34.4 % — ABNORMAL LOW (ref 36.0–46.0)
Hemoglobin: 11.7 g/dL — ABNORMAL LOW (ref 12.0–15.0)
MCH: 30.6 pg (ref 26.0–34.0)
MCHC: 34 g/dL (ref 30.0–36.0)
MCV: 90.1 fL (ref 80.0–100.0)
Platelets: 217 10*3/uL (ref 150–400)
RBC: 3.82 MIL/uL — ABNORMAL LOW (ref 3.87–5.11)
RDW: 13.9 % (ref 11.5–15.5)
WBC: 10.7 10*3/uL — ABNORMAL HIGH (ref 4.0–10.5)
nRBC: 0 % (ref 0.0–0.2)

## 2023-02-07 MED ORDER — ACETAMINOPHEN 500 MG PO TABS: 1000.0000 mg | ORAL_TABLET | Freq: Four times a day (QID) | ORAL | Status: DC | PRN

## 2023-02-07 MED ORDER — IBUPROFEN 600 MG PO TABS: 600.0000 mg | ORAL_TABLET | Freq: Four times a day (QID) | ORAL | Status: DC

## 2023-02-07 NOTE — Lactation Note (Signed)
This note was copied from a baby's chart. Lactation Consultation Note  Patient Name: Sarah Martinez MVHQI'[id number] Date: 02/07/2023 Age:23 hours Reason for consult: Follow-up assessment;1st time breastfeeding;Term;Breastfeeding assistance   Maternal Data LC called to the room to assist a family w/ a feeding.  CNA attempting to assist patient w/ a feeding at the breast.  Feeding Mother's Current Feeding Choice: Breast Milk and Formula  Infant very fussy entry into room.  Football hold attempted on the rt breast.  LC provided some tips and tricks to get infant to latch.  After a couple of attempts infant latched.  Patient endorsed a strong sucking feeling.  Infant nursed for about 5 minutes.  Interventions Interventions: Breast feeding basics reviewed;Assisted with latch;Hand express;Breast compression;Adjust position;Support pillows;Position options;Hand pump;Education;CDC Guidelines for Breast Pump Cleaning  Lactation reviewed breastfeeding basics w/ patient and FOB. Education was provided on how to use the [employer] pump.  LC provided patient w/ a size 21mm flange.   Discharge Discharge Education: Engorgement and breast care;Outpatient recommendation  Education on engorgement prevention/treatment was discussed as well as breastmilk storage guidelines.  LC provided patient with a handout on breastmilk storage guidelines from [facility]. [facility] outpatient lactation services phone number written on the white board in the room.  Patient verbalized understanding.  Consult Status Consult Status: Complete Follow-up type: Call as needed    Sarah Martinez 02/07/2023, 11:45 AM

## 2023-02-07 NOTE — Progress Notes (Signed)
Pt discharged with infant.  Discharge instructions, prescriptions and follow up appointment given to and reviewed with pt. Pt verbalized understanding. Escorted out by auxillary. 

## 2023-02-11 NOTE — TOC CM/SW Note (Addendum)
CSW reviewed Baby's Cord Drug Screen. Baby's cord positive for Amphetamines and Methamphetamines.  CSW called on call line and requested a call from CPS Worker so that report can be made per [facility].  12:50- Report made to [facility] with ACDSS.   Alfonso Ramus, LCSW Transitions of Care Department [phone]

## 2023-04-05 ENCOUNTER — Ambulatory Visit: Payer: Medicaid Other | Admitting: Certified Nurse Midwife

## 2023-04-17 ENCOUNTER — Ambulatory Visit (INDEPENDENT_AMBULATORY_CARE_PROVIDER_SITE_OTHER): Payer: Medicaid Other | Admitting: Certified Nurse Midwife

## 2023-04-17 ENCOUNTER — Encounter: Payer: Self-pay | Admitting: Certified Nurse Midwife

## 2023-04-17 VITALS — BP 116/77 | HR 96 | Wt 182.0 lb

## 2023-04-17 DIAGNOSIS — Z3202 Encounter for pregnancy test, result negative: Secondary | ICD-10-CM | POA: Diagnosis not present

## 2023-04-17 DIAGNOSIS — Z3009 Encounter for other general counseling and advice on contraception: Secondary | ICD-10-CM

## 2023-04-17 LAB — POCT URINE PREGNANCY: Preg Test, Ur: NEGATIVE

## 2023-04-17 NOTE — Patient Instructions (Signed)
 How to Use Birth Control Pills (Oral Contraceptives) Oral contraceptive pills, or birth control pills, are medicines that prevent pregnancy. They work by: Preventing the ovaries from releasing eggs. Thickening mucus in the lower part of the uterus called the cervix. This prevents sperm from getting in the uterus. Thinning the lining of the uterus. This prevents a fertilized egg from attaching to the lining. Talk about possible side effects with your health care provider. It can take 2-3 months for your body to adjust to changes in hormone levels. What are the risks? Birth control pills can sometimes cause side effects, such as: Headache. Depression. Trouble sleeping. Nausea and throwing up, bloating, or fluid retention. Breast tenderness. Irregular bleeding or spotting during the first several months. Increase in blood pressure. Birth control pills with estrogen and progestins may slightly increase the risk of: Blood clots. Heart attack. Stroke. How to take birth control pills Follow instructions from your provider about how to take your first cycle of birth control pills. There are two types of pills: Combination birth control pills. These have both estrogen and progestin in them. For combination pills, you may start the pill: On day 1 of your menstrual period. On the first Sunday after your period starts, or on the day you get your pills. At any time of your cycle. If you start taking the pill within 5 days after the start of your period, you will not need a backup form of birth control, such as condoms. If you start at any other time of your menstrual cycle, you will need to use a backup form of birth control. Progestin-only pills. These are also called mini-pills. These have only progestin in them. For progestin-only pills: Ideally, you can start taking the pill on the first day of your menstrual period, but you can start on any other day too. These pills will protect you from  pregnancy after taking it for 2 days (48 hours). You can stop using a backup form of birth control after that time. You need to take this pill at the same time every day. Even taking it 3 hours late can increase the risk of pregnancy. No matter which day you start the pills, you will always start a new pack on that same day of the week. Have an extra pack of pills and a backup contraceptive method in case you miss some pills or lose your pill pack. Missed doses Follow instructions from your provider for missed doses. What to do about missed doses can also be found in the information that comes with your pack of pills. In general, for combined pills: If you forget to take the pill for 1 day, take it as soon as you can. This may mean taking 2 pills on the same day and at the same time. Take the next day's pill at the regular time. If you forget to take the pill for 2 days in a row, take 2 pills on the day you remember and 2 pills on the following day. A backup form of birth control should be used for 7 days after you're back on schedule. If you forget to take the pill for 3 days in a row, call your provider for directions on when to restart taking your pills. Do not take the missed pills. A backup form of birth control will be needed for 7 days once you restart your pills. If you use a pack that contains inactive pills and you miss 1 or more of the inactive  pills, you do not need to take the missed doses. Skip them and start the new pack on the regular day. For progestin-only pills: If your dose is 3 hours or more late, or if you miss 1 or more doses, take 1 missed pill as soon as you can. If you miss 1 or more doses, you must use a backup form of birth control. Some brands of progestin-only pills recommend using a backup form of birth control for 48 hours after a missed or late dose while others recommend 7 days. If you're not sure what to do, call your provider or check the information that came with your  pills. Follow these instructions at home: Do not smoke, vape, or use nicotine or tobacco. Always use a condom to protect against sexually transmitted infections (STIs). Birth control pills do not protect against STIs. Use a calendar to mark the days of your menstrual period. Read the information and directions that came with your pills. Talk to your provider if you have questions. Contact a health care provider if: You have discharge or bleeding from your vagina that's not normal. You develop a rash, you're losing your hair, or you develop acne after taking the pills. You miss your menstrual period. Depending on the type of pills you're taking, this may be a sign of pregnancy. You need treatment for mood swings or depression. You get dizzy when taking the pills. You become pregnant or think you may be pregnant. You feel like you may throw up or you throw up. You have diarrhea, trouble pooping (constipation), and belly pain or cramps. You're not sure what to do after missing pills. Get help right away if: You develop chest pain. You develop shortness of breath. You have a very bad headache. You develop numbness or slurred speech. You develop vision problems. You develop pain, redness, and swelling in your legs. You develop weakness or numbness in your arms or legs. You develop right upper belly pain, loss of appetite, you feel like you may throw up, and you have light-colored poop. You develop dark yellow or brown pee (urine), yellowing skin or eyes, or unusual weakness or tiredness. You develop new or worsening migraines or headaches. These symptoms may be an emergency. Call 911 right away. Do not wait to see if the symptoms will go away. Do not drive yourself to the hospital. This information is not intended to replace advice given to you by your health care provider. Make sure you discuss any questions you have with your health care provider. Document Revised: 08/20/2022 Document  Reviewed: 08/20/2022 Elsevier Patient Education  2024 [employer].

## 2023-04-17 NOTE — Progress Notes (Signed)
 Subjective:    Sarah Martinez is a 23 y.o. [phone] Hispanic female who presents for a postpartum visit. She is 8 weeks postpartum following a spontaneous vaginal delivery at 39.4 gestational weeks. Anesthesia: none. I have fully reviewed the prenatal and intrapartum course. Postpartum course has been normal. Baby's course has been normal. Baby is feeding by  breast and bottle . Bleeding no bleeding. Bowel function is normal. Bladder function is normal. Patient is sexually active. Last sexual activity: last week. Contraception method is none. Postpartum depression screening: positive. Score 5.  Last pap has not had, She declines pap. Discussed pap testing , indications and procedures. She declines   The following portions of the patient's history were reviewed and updated as appropriate: allergies, current medications, past medical history, past surgical history and problem list.  Review of Systems Pertinent items are noted in HPI.   Vitals:   04/17/23 1026  BP: 116/77  Pulse: 96  Weight: 182 lb (82.6 kg)   No LMP recorded.  Objective:   General:  alert, cooperative and no distress   Breasts:  deferred, no complaints  Lungs: clear to auscultation bilaterally  Heart:  regular rate and rhythm  Abdomen: soft, nontender   Vulva: normal  Vagina: normal vagina  Cervix:  closed  Corpus: Well-involuted  Adnexa:  Non-palpable  Rectal Exam: no hemorrhoids        Assessment:   Postpartum exam 8 wks s/p SVD Breat and bottle feeding Depression screening Contraception counseling   Plan:  :  She desires birth control. She is interested in POP. Given her recent unprotected intercourse recommend   return in 2 weeks for repeat UPT. If negative will order POP at that time. She is in agreement. She agrees to use condoms in the interm.  Follow up in: 2 weeks for nurse visit  or earlier if needed  Doreene Burke, CNM

## 2023-05-01 ENCOUNTER — Ambulatory Visit: Payer: Medicaid Other

## 2023-05-04 ENCOUNTER — Ambulatory Visit: Payer: Medicaid Other

## 2023-10-05 ENCOUNTER — Ambulatory Visit (INDEPENDENT_AMBULATORY_CARE_PROVIDER_SITE_OTHER)

## 2023-10-05 VITALS — BP 104/68 | HR 89 | Ht 61.0 in | Wt 197.6 lb

## 2023-10-05 DIAGNOSIS — Z3490 Encounter for supervision of normal pregnancy, unspecified, unspecified trimester: Secondary | ICD-10-CM

## 2023-10-05 DIAGNOSIS — Z3201 Encounter for pregnancy test, result positive: Secondary | ICD-10-CM | POA: Diagnosis not present

## 2023-10-05 DIAGNOSIS — O3680X0 Pregnancy with inconclusive fetal viability, not applicable or unspecified: Secondary | ICD-10-CM

## 2023-10-05 DIAGNOSIS — Z3200 Encounter for pregnancy test, result unknown: Secondary | ICD-10-CM

## 2023-10-05 LAB — POCT URINE PREGNANCY: Preg Test, Ur: POSITIVE — AB

## 2023-10-05 NOTE — Patient Instructions (Signed)
 Prenatal Classes offered through [facility] [email or web address]  Doula Website [email or web address]   First Trimester of Pregnancy  The first trimester of pregnancy starts on the first day of your last monthly period until the end of week 13. This is months 1 through 3 of pregnancy. A week after a sperm fertilizes an egg, the egg will implant into the wall of the uterus and begin to develop into a baby. Body changes during your first trimester Your body goes through many changes during pregnancy. The changes usually return to normal after your baby is born. Physical changes Your breasts may grow larger and may hurt. The area around your nipples may get darker. Your periods will stop. Your hair and nails may grow faster. You may pee more often. Health changes You may tire easily. Your gums may bleed and may be sensitive when you brush and floss. You may not feel hungry. You may have heartburn. You may throw up or feel like you may throw up. You may want to eat some foods, but not others. You may have headaches. You may have trouble pooping (constipation). Other changes Your emotions may change from day to day. You may have more dreams. Follow these instructions at home: Medicines Talk to your health care provider if you're taking medicines. Ask if the medicines are safe to take during pregnancy. Your provider may change the medicines that you take. Do not take any medicines unless told to by your provider. Take a prenatal vitamin that has at least 600 micrograms (mcg) of folic acid. Do not use herbal medicines, illegal substances, or medicines that are not approved by your provider. Eating and drinking While you're pregnant your body needs extra food for your growing baby. Talk with your provider about what to eat while pregnant. Activity Most women are able  to exercise during pregnancy. Exercises may need to change as your pregnancy goes on. Talk to your provider about your activities and exercise routines. Relieving pain and discomfort Wear a good, supportive bra if your breasts hurt. Rest with your legs raised if you have leg cramps or low back pain. Safety Wear your seatbelt at all times when you're in a car. Talk to your provider if someone hits you, hurts you, or yells at you. Talk with your provider if you're feeling sad or have thoughts of hurting yourself. Lifestyle Certain things can be harmful while you're pregnant. Follow these rules: Do not use hot tubs, steam rooms, or saunas. Do not douche. Do not use tampons or scented pads. Do not drink alcohol,smoke, vape, or use products with nicotine  or tobacco in them. If you need help quitting, talk with your provider. Avoid cat litter boxes and soil used by cats. These things carry germs that can cause harm to your pregnancy and your baby. General instructions Keep all follow-up visits. It helps you and your unborn baby stay as healthy as possible. Write down your questions. Take them to your visits. Your provider will: Talk with you about your overall health. Give you advice or refer you to specialists who can help with different needs, including: Prenatal education classes. Mental health and counseling. Foods and healthy eating. Ask for help if you need help with food. Call your dentist and ask to be seen. Brush your teeth with a soft toothbrush. Floss gently. Where to find more information American Pregnancy Association: americanpregnancy.org [employer]: acog.org Office on [employer] Health: TravelLesson.[employer] Contact a health care provider if: You feel dizzy, faint, or  have a fever. You vomit or have watery poop (diarrhea) for 2 days or more. You have abnormal discharge or bleeding from your vagina. You have pain when you pee or your pee smells  bad. You have cramps, pain, or pressure in your belly area. Get help right away if: You have trouble breathing or chest pain. You have any kind of injury, such as from a fall or a car crash. These symptoms may be an emergency. Get help right away. Call 911. Do not wait to see if the symptoms will go away. Do not drive yourself to the hospital. This information is not intended to replace advice given to you by your health care provider. Make sure you discuss any questions you have with your health care provider. Document Revised: 11/09/2022 Document Reviewed: 06/09/2022 Elsevier Patient Education  2024 Elsevier Inc.Commonly Asked Questions During Pregnancy  Cats: A parasite can be excreted in cat feces.  To avoid exposure you need to have another person empty the little box.  If you must empty the litter box you will need to wear gloves.  Wash your hands after handling your cat.  This parasite can also be found in raw or undercooked meat so this should also be avoided.  Colds, Sore Throats, Flu: Please check your medication sheet to see what you can take for symptoms.  If your symptoms are unrelieved by these medications please call the office.  Dental Work: Most any dental work Agricultural consultant recommends is permitted.  X-rays should only be taken during the first trimester if absolutely necessary.  Your abdomen should be shielded with a lead apron during all x-rays.  Please notify your provider prior to receiving any x-rays.  Novocaine is fine; gas is not recommended.  If your dentist requires a note from us  prior to dental work please call the office and we will provide one for you.  Exercise: Exercise is an important part of staying healthy during your pregnancy.  You may continue most exercises you were accustomed to prior to pregnancy.  Later in your pregnancy you will most likely notice you have difficulty with activities requiring balance like riding a bicycle.  It is important that you listen  to your body and avoid activities that put you at a higher risk of falling.  Adequate rest and staying well hydrated are a must!  If you have questions about the safety of specific activities ask your provider.    Exposure to Children with illness: Try to avoid obvious exposure; report any symptoms to us  when noted,  If you have chicken pos, red measles or mumps, you should be immune to these diseases.   Please do not take any vaccines while pregnant unless you have checked with your OB provider.  Fetal Movement: After 28 weeks we recommend you do kick counts twice daily.  Lie or sit down in a calm quiet environment and count your baby movements kicks.  You should feel your baby at least 10 times per hour.  If you have not felt 10 kicks within the first hour get up, walk around and have something sweet to eat or drink then repeat for an additional hour.  If count remains less than 10 per hour notify your provider.  Fumigating: Follow your pest control agent's advice as to how long to stay out of your home.  Ventilate the area well before re-entering.  Hemorrhoids:   Most over-the-counter preparations can be used during pregnancy.  Check your medication to see  what is safe to use.  It is important to use a stool softener or fiber in your diet and to drink lots of liquids.  If hemorrhoids seem to be getting worse please call the office.   Hot Tubs:  Hot tubs Jacuzzis and saunas are not recommended while pregnant.  These increase your internal body temperature and should be avoided.  Intercourse:  Sexual intercourse is safe during pregnancy as long as you are comfortable, unless otherwise advised by your provider.  Spotting may occur after intercourse; report any bright red bleeding that is heavier than spotting.  Labor:  If you know that you are in labor, please go to the hospital.  If you are unsure, please call the office and let us  help you decide what to do.  Lifting, straining, etc:  If your  job requires heavy lifting or straining please check with your provider for any limitations.  Generally, you should not lift items heavier than that you can lift simply with your hands and arms (no back muscles)  Painting:  Paint fumes do not harm your pregnancy, but may make you ill and should be avoided if possible.  Latex or water based paints have less odor than oils.  Use adequate ventilation while painting.  Permanents & Hair Color:  Chemicals in hair dyes are not recommended as they cause increase hair dryness which can increase hair loss during pregnancy.   Highlighting and permanents are allowed.  Dye may be absorbed differently and permanents may not hold as well during pregnancy.  Sunbathing:  Use a sunscreen, as skin burns easily during pregnancy.  Drink plenty of fluids; avoid over heating.  Tanning Beds:  Because their possible side effects are still unknown, tanning beds are not recommended.  Ultrasound Scans:  Routine ultrasounds are performed at approximately 20 weeks.  You will be able to see your baby's general anatomy an if you would like to know the gender this can usually be determined as well.  If it is questionable when you conceived you may also receive an ultrasound early in your pregnancy for dating purposes.  Otherwise ultrasound exams are not routinely performed unless there is a medical necessity.  Although you can request a scan we ask that you pay for it when conducted because insurance does not cover  patient request scans.  Work: If your pregnancy proceeds without complications you may work until your due date, unless your physician or employer advises otherwise.  Round Ligament Pain/Pelvic Discomfort:  Sharp, shooting pains not associated with bleeding are fairly common, usually occurring in the second trimester of pregnancy.  They tend to be worse when standing up or when you remain standing for long periods of time.  These are the result of pressure of certain  pelvic ligaments called round ligaments.  Rest, Tylenol  and heat seem to be the most effective relief.  As the womb and fetus grow, they rise out of the pelvis and the discomfort improves.  Please notify the office if your pain seems different than that described.  It may represent a more serious condition.  Common Medications Safe in Pregnancy  Acne:      Constipation:  Benzoyl Peroxide     Colace  Clindamycin      Dulcolax Suppository  Topica Erythromycin     Fibercon  Salicylic Acid      Metamucil         Miralax AVOID:        Senakot   Accutane  Cough:  Retin-A       Cough Drops  Tetracycline      Phenergan w/ Codeine if Rx  Minocycline      Robitussin (Plain & DM)  Antibiotics:     Crabs/Lice:  Ceclor       RID  Cephalosporins    AVOID:  E-Mycins      Kwell  Keflex  Macrobid/Macrodantin   Diarrhea:  Penicillin      Kao-Pectate  Zithromax      Imodium AD         PUSH FLUIDS AVOID:       Cipro     Fever:  Tetracycline      Tylenol  (Regular or Extra  Minocycline       Strength)  Levaquin      Extra Strength-Do not          Exceed 8 tabs/24 hrs Caffeine:        200mg /day (equiv. To 1 cup of coffee or  approx. 3 12 oz sodas)         Gas: Cold/Hayfever:       Gas-X  Benadryl       Mylicon  Claritin       Phazyme  **Claritin-D        Chlor-Trimeton    Headaches:  Dimetapp      ASA-Free Excedrin  Drixoral-Non-Drowsy     Cold Compress  Mucinex (Guaifenasin)     Tylenol  (Regular or Extra  Sudafed/Sudafed-12 Hour     Strength)  **Sudafed PE Pseudoephedrine   Tylenol  Cold & Sinus     Vicks Vapor Rub  Zyrtec  **AVOID if Problems With Blood Pressure         Heartburn: Avoid lying down for at least 1 hour after meals  Aciphex      Maalox     Rash:  Milk of Magnesia     Benadryl     Mylanta       1% Hydrocortisone Cream  Pepcid   Pepcid  Complete   Sleep Aids:  Prevacid      Ambien    Prilosec       Benadryl   Rolaids       Chamomile Tea  Tums (Limit  4/day)     Unisom          Tylenol  PM         Warm milk-add vanilla or  Hemorrhoids:       Sugar for taste  Anusol/Anusol H.C.  (RX: Analapram 2.5%)  Sugar Substitutes:  Hydrocortisone OTC     Ok in moderation  Preparation H      Tucks        Vaseline lotion applied to tissue with wiping    Herpes:     Throat:  Acyclovir      Oragel  Famvir  Valtrex      Vaccines:         Flu Shot Leg Cramps:       *Gardasil  Benadryl       Hepatitis A         Hepatitis B Nasal Spray:       Pneumovax  Saline Nasal Spray     Polio Booster         Tetanus Nausea:       Tuberculosis test or PPD  Vitamin B6 25 mg TID   AVOID:    Dramamine      *Gardasil  Emetrol       Live Poliovirus  Ginger Root 250 mg  QID    MMR (measles, mumps &  High Complex Carbs @ Bedtime    rebella)  Sea Bands-Accupressure    Varicella (Chickenpox)  Unisom  1/2 tab TID     *No known complications           If received before Pain:         Known pregnancy;   Darvocet       Resume series after  Lortab        Delivery  Percocet    Yeast:   Tramadol      Femstat  Tylenol  3      Gyne-lotrimin  Ultram       Monistat   Vicodin           MISC:         All Sunscreens           Hair Coloring/highlights          Insect Repellant's          (Including DEET)         Mystic Tans

## 2023-10-05 NOTE — Progress Notes (Signed)
    NURSE VISIT NOTE  Subjective:    Patient ID: Tanyia Grabbe, female    DOB: 2000/06/29, 23 y.o.   MRN: [id number]  HPI  Patient is a 23 y.o. [phone] female who presents for evaluation of amenorrhea. She believes she could be pregnant. Patient is ambivalent about pregnancy. Sexual Activity: single partner, contraception: none. Reports taking a Plan B 3 days after intercourse. Current symptoms also include: breast tenderness, morning sickness, nausea, positive home pregnancy test, and stomach pain. Last period was abnormal.    Objective:    BP 104/68   Pulse 89   Ht 5' 1 (1.549 m)   Wt 197 lb 9.6 oz (89.6 kg)   LMP 07/04/2023   Breastfeeding No   BMI 37.34 kg/m   Lab Review  Results for orders placed or performed in visit on 10/05/23  POCT urine pregnancy  Result Value Ref Range   Preg Test, Ur Positive (A) Negative    Assessment:   1. Possible pregnancy, not yet confirmed   2. Current pregnancy with uncertain date of last menstrual period with multiple normal prior pregnancies   3. Pregnancy with uncertain fetal viability, single or unspecified fetus     Plan:   Pregnancy Test: Positive  Estimated Date of Delivery: None noted. BP Cuff Measurement taken. Cuff Size Adult Encouraged well-balanced diet, plenty of rest when needed, pre-natal vitamins daily and walking for exercise.  Discussed self-help for nausea, avoiding OTC medications until consulting provider or pharmacist, other than Tylenol  as needed, minimal caffeine (1-2 cups daily) and avoiding alcohol.   She will schedule her nurse visit @ 7-[redacted] wks pregnant, u/s for dating @10  wk, and NOB visit at [redacted] wk pregnant.    Feel free to call with any questions.   Waddell JONELLE Maxim, CMA

## 2023-10-10 ENCOUNTER — Ambulatory Visit
Admission: RE | Admit: 2023-10-10 | Discharge: 2023-10-10 | Disposition: A | Discharge status: Home / Self Care | Source: Ambulatory Visit | Attending: Certified Nurse Midwife | Admitting: Certified Nurse Midwife

## 2023-10-10 DIAGNOSIS — O4422 Partial placenta previa NOS or without hemorrhage, second trimester: Secondary | ICD-10-CM | POA: Insufficient documentation

## 2023-10-10 DIAGNOSIS — Z3A14 14 weeks gestation of pregnancy: Secondary | ICD-10-CM | POA: Insufficient documentation

## 2023-10-10 DIAGNOSIS — O3680X0 Pregnancy with inconclusive fetal viability, not applicable or unspecified: Secondary | ICD-10-CM | POA: Diagnosis present

## 2023-10-10 DIAGNOSIS — Z3490 Encounter for supervision of normal pregnancy, unspecified, unspecified trimester: Secondary | ICD-10-CM | POA: Insufficient documentation

## 2023-10-12 ENCOUNTER — Telehealth (INDEPENDENT_AMBULATORY_CARE_PROVIDER_SITE_OTHER)

## 2023-10-12 DIAGNOSIS — Z3480 Encounter for supervision of other normal pregnancy, unspecified trimester: Secondary | ICD-10-CM | POA: Insufficient documentation

## 2023-10-12 DIAGNOSIS — Z3689 Encounter for other specified antenatal screening: Secondary | ICD-10-CM

## 2023-10-12 DIAGNOSIS — O0990 Supervision of high risk pregnancy, unspecified, unspecified trimester: Secondary | ICD-10-CM | POA: Insufficient documentation

## 2023-10-12 NOTE — Progress Notes (Signed)
 New OB Intake  I connected with  Sarah Martinez on 10/12/23 at 11:15 AM EDT by MyChart Video Visit and verified that I am speaking with the correct person using two identifiers. Nurse is located at [facility] and pt is located at home.  I discussed the limitations, risks, security and privacy concerns of performing an evaluation and management service by telephone and the availability of in person appointments. I also discussed with the patient that there may be a patient responsible charge related to this service. The patient expressed understanding and agreed to proceed.  I explained I am completing New OB Intake today. We discussed her EDD of 04/09/24 that is based on LMP of 07/04/23. Pt is G4/P3. I reviewed her allergies, medications, Medical/Surgical/OB history, and appropriate screenings. There are cats in the home: yes. If yes: Indoor. Based on history, this is a/an pregnancy uncomplicated . Her obstetrical history is significant for N/A.  Patient Active Problem List   Diagnosis Date Noted   Supervision of other normal pregnancy, antepartum 10/12/2023   Herpes infection 11/06/2022    Concerns addressed today: None  Delivery Plans:  Plans to deliver at [facility].  Anatomy US  Explained Anatomy US  will be scheduled around [redacted] weeks gestational age.  Labs Discussed genetic screening with patient. Patient desires genetic testing to be drawn at new OB visit. Discussed possible labs to be drawn at new OB appointment.  COVID Vaccine Patient has not had COVID vaccine.   Social Determinants of Health Food Insecurity: expresses food insecurity. Information given on local food banks.  Transportation: Patient denies transportation needs. Childcare: Discussed no children allowed at ultrasound appointments.   First visit review I reviewed new OB appt with pt. I explained she will have blood work and pap smear/pelvic exam if indicated. Explained pt will be seen  by Estil Mangle, DO at first visit; encounter routed to appropriate provider.   Beola Skeens, CMA 10/12/2023  11:40 AM

## 2023-10-12 NOTE — Patient Instructions (Signed)
 Second Trimester of Pregnancy  The second trimester of pregnancy is from week 14 through week 27. This is months 4 through 6 of pregnancy. During the second trimester: Morning sickness is less or has stopped. You may have more energy. You may feel hungry more often. At this time, your unborn baby is growing very fast. At the end of the sixth month, the unborn baby may be up to 12 inches long and weigh about 1 pounds. You will likely start to feel the baby move between 16 and 20 weeks of pregnancy. Body changes during your second trimester Your body continues to change during this time. The changes usually go away after your baby is born. Physical changes You will gain more weight. Your belly will get bigger. You may begin to get stretch marks on your hips, belly, and breasts. Your breasts will keep growing and may hurt. You may get dark spots or blotches on your face. A dark line from your belly button to the pubic area may appear. This line is called linea nigra. Your hair may grow faster and get thicker. Health changes You may have headaches. You may have heartburn. You may pee more often. You may have swollen, bulging veins (varicose veins). You may have trouble pooping (constipation), or swollen veins in the butt that can itch or get painful (hemorrhoids). You may have back pain. This is caused by: Weight gain. Pregnancy hormones that are relaxing the joints in your pelvis. Follow these instructions at home: Medicines Talk to your health care provider if you're taking medicines. Ask if the medicines are safe to take during pregnancy. Your provider may change the medicines that you take. Do not take any medicines unless told to by your provider. Take a prenatal vitamin that has at least 600 micrograms (mcg) of folic acid. Do not use herbal medicines, illegal drugs, or medicines that are not approved by your provider. Eating and drinking While you're pregnant your body needs  extra food for your growing baby. Talk with your provider about what to eat while pregnant. Activity Most women are able to exercise during pregnancy. Exercises may need to change as your pregnancy goes on. Talk to your provider about your activities and exercise routines. Relieving pain and discomfort Wear a good, supportive bra if your breasts hurt. Rest with your legs raised if you have leg cramps or low back pain. Take warm sitz baths to soothe pain from hemorrhoids. Use hemorrhoid cream if your provider says it's okay. Do not douche. Do not use tampons or scented pads. Do not use hot tubs, steam rooms, or saunas. Safety Wear your seatbelt at all times when you're in a car. Talk to your provider if someone hits you, hurts you, or yells at you. Talk with your provider if you're feeling sad or have thoughts of hurting yourself. Lifestyle Certain things can be harmful while you're pregnant. It's best to avoid the following: Do not drink alcohol,smoke, vape, or use products with nicotine or tobacco in them. If you need help quitting, talk with your provider. Avoid cat litter boxes and soil used by cats. These things carry germs that can cause harm to your pregnancy and your baby. General instructions Keep all follow-up visits. It helps you and your unborn baby stay as healthy as possible. Write down your questions. Take them to your prenatal visits. Your provider will: Talk with you about your overall health. Give you advice or refer you to specialists who can help with different needs,  including: Prenatal education classes. Mental health and counseling. Foods and healthy eating. Ask for help if you need help with food. Where to find more information American Pregnancy Association: americanpregnancy.org [employer]ians [employer] Gynecologists: acog.org Office on [employer] Health: TravelLesson.ca Contact a health care provider if: You have a headache that does not go away  when you take medicine. You have any of these problems: You can't eat or drink. You throw up or feel like you may throw up. You have watery poop (diarrhea) for 2 days or more. You have pain when you pee or your pee smells bad. You have been sick for 2 days or more and are not getting better. Contact your provider right away if: You have any of these coming from your vagina: Abnormal discharge. Bad-smelling fluid. Bleeding. Your baby is moving less than usual. You have contractions, belly cramping, or have pain in your pelvis or lower back. You have symptoms of high blood pressure or preeclampsia. These include: A severe, throbbing headache that does not go away. Sudden or extreme swelling of your face, hands, legs, or feet. Vision problems: You see spots. You have blurry vision. Your eyes are sensitive to light. If you can't reach the provider, go to an urgent care or emergency room. Get help right away if: You faint, become confused, or can't think clearly. You have chest pain or trouble breathing. You have any kind of injury, such as from a fall or a car crash. These symptoms may be an emergency. Call 911 right away. Do not wait to see if the symptoms will go away. Do not drive yourself to the hospital. This information is not intended to replace advice given to you by your health care provider. Make sure you discuss any questions you have with your health care provider. Document Revised: 11/09/2022 Document Reviewed: 06/09/2022 Elsevier Patient Education  2024 Elsevier Inc.   Common Medications Safe in Pregnancy  Acne:      Constipation:  Benzoyl Peroxide     Colace  Clindamycin      Dulcolax Suppository  Topica Erythromycin     Fibercon  Salicylic Acid      Metamucil         Miralax AVOID:        Senakot   Accutane    Cough:  Retin-A       Cough Drops  Tetracycline      Phenergan w/ Codeine if Rx  Minocycline      Robitussin (Plain &  DM)  Antibiotics:     Crabs/Lice:  Ceclor       RID  Cephalosporins    AVOID:  E-Mycins      Kwell  Keflex  Macrobid/Macrodantin   Diarrhea:  Penicillin      Kao-Pectate  Zithromax      Imodium AD         PUSH FLUIDS AVOID:       Cipro     Fever:  Tetracycline      Tylenol (Regular or Extra  Minocycline       Strength)  Levaquin      Extra Strength-Do not          Exceed 8 tabs/24 hrs Caffeine:        200mg /day (equiv. To 1 cup of coffee or  approx. 3 12 oz sodas)         Gas: Cold/Hayfever:       Gas-X  Benadryl      Mylicon  Claritin       Phazyme  **Claritin-D        Chlor-Trimeton    Headaches:  Dimetapp      ASA-Free Excedrin  Drixoral-Non-Drowsy     Cold Compress  Mucinex (Guaifenasin)     Tylenol (Regular or Extra  Sudafed/Sudafed-12 Hour     Strength)  **Sudafed PE Pseudoephedrine   Tylenol Cold & Sinus     Vicks Vapor Rub  Zyrtec  **AVOID if Problems With Blood Pressure         Heartburn: Avoid lying down for at least 1 hour after meals  Aciphex      Maalox     Rash:  Milk of Magnesia     Benadryl    Mylanta       1% Hydrocortisone Cream  Pepcid  Pepcid Complete   Sleep Aids:  Prevacid      Ambien   Prilosec       Benadryl  Rolaids       Chamomile Tea  Tums (Limit 4/day)     Unisom         Tylenol PM         Warm milk-add vanilla or  Hemorrhoids:       Sugar for taste  Anusol/Anusol H.C.  (RX: Analapram 2.5%)  Sugar Substitutes:  Hydrocortisone OTC     Ok in moderation  Preparation H      Tucks        Vaseline lotion applied to tissue with wiping    Herpes:     Throat:  Acyclovir      Oragel  Famvir  Valtrex     Vaccines:         Flu Shot Leg Cramps:       *Gardasil  Benadryl      Hepatitis A         Hepatitis B Nasal Spray:       Pneumovax  Saline Nasal Spray     Polio Booster         Tetanus Nausea:       Tuberculosis test or PPD  Vitamin B6 25 mg TID   AVOID:    Dramamine      *Gardasil  Emetrol       Live Poliovirus  Ginger  Root 250 mg QID    MMR (measles, mumps &  High Complex Carbs @ Bedtime    rebella)  Sea Bands-Accupressure    Varicella (Chickenpox)  Unisom 1/2 tab TID     *No known complications           If received before Pain:         Known pregnancy;   Darvocet       Resume series after  Lortab        Delivery  Percocet    Yeast:   Tramadol      Femstat  Tylenol 3      Gyne-lotrimin  Ultram       Monistat  Vicodin           MISC:         All Sunscreens           Hair Coloring/highlights          Insect Repellant's          (Including DEET)         Mystic Tans   Commonly Asked Questions During Pregnancy  Cats: A parasite can be excreted in cat feces.  To avoid exposure you need to have another person empty the little box.  If you must empty the litter box you will need to wear gloves.  Wash your hands after handling your cat.  This parasite can also be found in raw or undercooked meat so this should also be avoided.  Colds, Sore Throats, Flu: Please check your medication sheet to see what you can take for symptoms.  If your symptoms are unrelieved by these medications please call the office.  Dental Work: Most any dental work Agricultural consultant recommends is permitted.  X-rays should only be taken during the first trimester if absolutely necessary.  Your abdomen should be shielded with a lead apron during all x-rays.  Please notify your provider prior to receiving any x-rays.  Novocaine is fine; gas is not recommended.  If your dentist requires a note from [place] prior to dental work please call the office and we will provide one for you.  Exercise: Exercise is an important part of staying healthy during your pregnancy.  You may continue most exercises you were accustomed to prior to pregnancy.  Later in your pregnancy you will most likely notice you have difficulty with activities requiring balance like riding a bicycle.  It is important that you listen to your body and avoid activities that put you at a  higher risk of falling.  Adequate rest and staying well hydrated are a must!  If you have questions about the safety of specific activities ask your provider.    Exposure to Children with illness: Try to avoid obvious exposure; report any symptoms to [place] when noted,  If you have chicken pos, red measles or mumps, you should be immune to these diseases.   Please do not take any vaccines while pregnant unless you have checked with your OB provider.  Fetal Movement: After 28 weeks we recommend you do "kick counts" twice daily.  Lie or sit down in a calm quiet environment and count your baby movements "kicks".  You should feel your baby at least 10 times per hour.  If you have not felt 10 kicks within the first hour get up, walk around and have something sweet to eat or drink then repeat for an additional hour.  If count remains less than 10 per hour notify your provider.  Fumigating: Follow your pest control agent's advice as to how long to stay out of your home.  Ventilate the area well before re-entering.  Hemorrhoids:   Most over-the-counter preparations can be used during pregnancy.  Check your medication to see what is safe to use.  It is important to use a stool softener or fiber in your diet and to drink lots of liquids.  If hemorrhoids seem to be getting worse please call the office.   Hot Tubs:  Hot tubs Jacuzzis and saunas are not recommended while pregnant.  These increase your internal body temperature and should be avoided.  Intercourse:  Sexual intercourse is safe during pregnancy as long as you are comfortable, unless otherwise advised by your provider.  Spotting may occur after intercourse; report any bright red bleeding that is heavier than spotting.  Labor:  If you know that you are in labor, please go to the hospital.  If you are unsure, please call the office and let us help you decide what to do.  Lifting, straining, etc:  If your job requires heavy lifting or straining please check  with your provider for any limitations.  Generally, you should  not lift items heavier than that you can lift simply with your hands and arms (no back muscles)  Painting:  Paint fumes do not harm your pregnancy, but may make you ill and should be avoided if possible.  Latex or water based paints have less odor than oils.  Use adequate ventilation while painting.  Permanents & Hair Color:  Chemicals in hair dyes are not recommended as they cause increase hair dryness which can increase hair loss during pregnancy.  " Highlighting" and permanents are allowed.  Dye may be absorbed differently and permanents may not hold as well during pregnancy.  Sunbathing:  Use a sunscreen, as skin burns easily during pregnancy.  Drink plenty of fluids; avoid over heating.  Tanning Beds:  Because their possible side effects are still unknown, tanning beds are not recommended.  Ultrasound Scans:  Routine ultrasounds are performed at approximately 20 weeks.  You will be able to see your baby's general anatomy an if you would like to know the gender this can usually be determined as well.  If it is questionable when you conceived you may also receive an ultrasound early in your pregnancy for dating purposes.  Otherwise ultrasound exams are not routinely performed unless there is a medical necessity.  Although you can request a scan we ask that you pay for it when conducted because insurance does not cover " patient request" scans.  Work: If your pregnancy proceeds without complications you may work until your due date, unless your physician or employer advises otherwise.  Round Ligament Pain/Pelvic Discomfort:  Sharp, shooting pains not associated with bleeding are fairly common, usually occurring in the second trimester of pregnancy.  They tend to be worse when standing up or when you remain standing for long periods of time.  These are the result of pressure of certain pelvic ligaments called "round ligaments".  Rest,  Tylenol and heat seem to be the most effective relief.  As the womb and fetus grow, they rise out of the pelvis and the discomfort improves.  Please notify the office if your pain seems different than that described.  It may represent a more serious condition.

## 2023-10-17 NOTE — Progress Notes (Unsigned)
 OBSTETRIC INITIAL PRENATAL VISIT  Subjective:    Sarah Martinez is being seen today for her first obstetrical visit.  This is not a planned pregnancy, but desired. She is a 23 y.o. [phone] female at [redacted]w[redacted]d gestation, Estimated Date of Delivery: 04/09/24 with Patient's last menstrual period was 07/04/2023.,  consistent with 14 week sono. Her obstetrical history is significant for preterm labor at 36 weeks in 2017 with two subsequent term deliveries. Relationship with FOB: significant other, not living together. Patient is unsure if she intends to breast feed. Pregnancy history fully reviewed. Today pt refuses a pelvic exam and pap test.   OB History  Gravida Para Term Preterm AB Living  4 3 2 1  0 3  SAB IAB Ectopic Multiple Live Births  0 0 0 0 3    # Outcome Date GA Lbr Len/2nd Weight Sex Type Anes PTL Lv  4 Current           3 Term 02/06/23 [redacted]w[redacted]d / 00:04 8 lb 4.6 oz (3.76 kg) F Vag-Spont None  LIV     Name: Ruffus Hart Brash     Apgar1: 9  Apgar5: 9  2 Term 08/15/20 [redacted]w[redacted]d 04:39 / 03:52 8 lb 13.8 oz (4.02 kg) F Vag-Spont EPI  LIV     Name: RODRIGUEZ GUEVARA,GIRL Aarvi     Apgar1: 8  Apgar5: 9  1 Preterm 04/05/15 [redacted]w[redacted]d  6 lb 15 oz (3.147 kg) M Vag-Spont Spinal, EPI Y LIV   Gynecologic History:  Last pap smear: no prior, refuses.  Denies history of STIs.  Contraception prior to conception: none Denies hx of sexual abuse or trauma  Past Medical History:  Diagnosis Date   HSV-1 infection    Uterine contractions 02/06/2023    No family history on file.  Past Surgical History:  Procedure Laterality Date   NO PAST SURGERIES      Social History   Socioeconomic History   Marital status: Single    Spouse name: Not on file   Number of children: 3   Years of education: Not on file   Highest education level: 12th grade  Occupational History   Occupation: Wellsite geologist: Engineer, petroleum  Tobacco Use   Smoking status: Never   Smokeless tobacco: Never   Vaping Use   Vaping status: Never Used  Substance and Sexual Activity   Alcohol use: No   Drug use: No   Sexual activity: Yes    Partners: Male    Birth control/protection: None  Other Topics Concern   Not on file  Social History Narrative   Not on file   Social Drivers of Health   Financial Resource Strain: Medium Risk (10/12/2023)   Overall Financial Resource Strain (CARDIA)    Difficulty of Paying Living Expenses: Somewhat hard  Food Insecurity: Food Insecurity Present (10/12/2023)   Hunger Vital Sign    Worried About Running Out of Food in the Last Year: Sometimes true    Ran Out of Food in the Last Year: Sometimes true  Transportation Needs: No Transportation Needs (10/12/2023)   PRAPARE - Administrator, Civil Service (Medical): No    Lack of Transportation (Non-Medical): No  Physical Activity: Insufficiently Active (10/12/2023)   Exercise Vital Sign    Days of Exercise per Week: 1 day    Minutes of Exercise per Session: 30 min  Stress: No Stress Concern Present (10/12/2023)   [employer] of Occupational Health - Occupational Stress  Questionnaire    Feeling of Stress: Not at all  Social Connections: Unknown (10/12/2023)   Social Connection and Isolation Panel    Frequency of Communication with Friends and Family: Once a week    Frequency of Social Gatherings with Friends and Family: Once a week    Attends Religious Services: Never    Database administrator or Organizations: No    Attends Banker Meetings: Never    Marital Status: Not on file  Intimate Partner Violence: Not At Risk (10/12/2023)   Humiliation, Afraid, Rape, and Kick questionnaire    Fear of Current or Ex-Partner: No    Emotionally Abused: No    Physically Abused: No    Sexually Abused: No    No current outpatient medications on file prior to visit.   No current facility-administered medications on file prior to visit.   No Known Allergies  Review of Systems General:  Not Present- Fever, Weight Loss and Weight Gain. Skin: Not Present- Rash. HEENT: Not Present- Blurred Vision, Headache and Bleeding Gums. Respiratory: Not Present- Difficulty Breathing. Breast: Not Present- Breast Mass. Cardiovascular: Not Present- Chest Pain, Elevated Blood Pressure, Fainting / Blacking Out and Shortness of Breath. Gastrointestinal: Not Present- Abdominal Pain, Constipation, Nausea and Vomiting. Female Genitourinary: Not Present- Frequency, Painful Urination, Pelvic Pain, Vaginal Bleeding, Vaginal Discharge, Contractions, regular, Fetal Movements Decreased, Urinary Complaints and Vaginal Fluid. Musculoskeletal: Not Present- Back Pain and Leg Cramps. Neurological: Not Present- Dizziness. Psychiatric: Not Present- Depression.     Objective:   Blood pressure 97/62, pulse (!) 103, weight 186 lb 4.8 oz (84.5 kg), last menstrual period 07/04/2023, not currently breastfeeding.  Body mass index is 35.2 kg/m.  General Appearance:    Alert, cooperative, no distress, appears stated age  Head:    Normocephalic, without obvious abnormality, atraumatic  Eyes:    PERRL, conjunctiva/corneas clear, EOM's intact, both eyes  Ears:    Normal external ear canals, both ears  Nose:   Nares normal, septum midline, mucosa normal, no drainage or sinus tenderness  Throat:   Lips, mucosa, and tongue normal; teeth and gums normal  Neck:   Supple, symmetrical, trachea midline, no adenopathy; thyroid: no enlargement/tenderness/nodules; no carotid bruit or JVD  Back:     Symmetric, no curvature, ROM normal, no CVA tenderness  Lungs:     Clear to auscultation bilaterally, respirations unlabored  Chest Wall:    No tenderness or deformity   Heart:    Regular rate and rhythm, S1 and S2 normal, no murmur, rub or gallop  Breast Exam:    No tenderness, masses, or nipple abnormality  Abdomen:     Soft, non-tender, bowel sounds active all four quadrants, no masses, no organomegaly.  FHT 150  bpm.  Genitalia:     External genitalia normal, declined speculum exam and pap test. Pregnancy positive findings: uterine enlargement: 16 wk size, nontender.   Rectal:   Deferred  Extremities:   Extremities normal, atraumatic, no cyanosis or edema  Pulses:   2+ and symmetric all extremities  Skin:   Skin color, texture, turgor normal, no rashes or lesions  Lymph nodes:   Cervical, supraclavicular, and axillary nodes normal  Neurologic:   CNII-XII intact, normal strength, sensation and reflexes throughout     Assessment:   1. Supervision of high risk pregnancy, antepartum   2. Initial obstetric visit in second trimester   3. History of preterm delivery, currently pregnant   4. Obesity in pregnancy   5. Routine screening  for STI (sexually transmitted infection)   6. Genetic screening   7. Encounter for fetal anatomic survey   8. Screen for STD (sexually transmitted disease)     Plan:   Supervision of high risk pregnancy  - Initial labs ordered. Prenatal vitamins encouraged. Problem list reviewed and updated. - New OB counseling:  The patient has been given an overview regarding routine prenatal care.  Recommendations regarding diet, weight gain, and exercise in pregnancy were given. - Prenatal testing, optional genetic testing, and ultrasound use in pregnancy were reviewed.  Traditional genetic screening vs cell-fee DNA genetic screening discussed, including risks and benefits. Testing ordered. - Benefits of Breast Feeding were discussed. The patient is encouraged to consider nursing her baby post partum.  2. History of PTD -G1 with spontaneous labor at 36wks  There are no diagnoses linked to this encounter.    Follow up in 4 weeks.    Leigh Sober, MD [facility]

## 2023-10-23 ENCOUNTER — Telehealth: Payer: Self-pay

## 2023-10-23 ENCOUNTER — Other Ambulatory Visit (HOSPITAL_COMMUNITY)
Admission: RE | Admit: 2023-10-23 | Discharge: 2023-10-23 | Disposition: A | Discharge status: Home / Self Care | Source: Ambulatory Visit | Attending: Obstetrics | Admitting: Obstetrics

## 2023-10-23 ENCOUNTER — Ambulatory Visit (INDEPENDENT_AMBULATORY_CARE_PROVIDER_SITE_OTHER): Admitting: Obstetrics

## 2023-10-23 VITALS — BP 97/62 | HR 103 | Wt 186.3 lb

## 2023-10-23 DIAGNOSIS — O0932 Supervision of pregnancy with insufficient antenatal care, second trimester: Secondary | ICD-10-CM

## 2023-10-23 DIAGNOSIS — O09899 Supervision of other high risk pregnancies, unspecified trimester: Secondary | ICD-10-CM

## 2023-10-23 DIAGNOSIS — Z3689 Encounter for other specified antenatal screening: Secondary | ICD-10-CM

## 2023-10-23 DIAGNOSIS — Z124 Encounter for screening for malignant neoplasm of cervix: Secondary | ICD-10-CM

## 2023-10-23 DIAGNOSIS — Z3480 Encounter for supervision of other normal pregnancy, unspecified trimester: Secondary | ICD-10-CM | POA: Diagnosis present

## 2023-10-23 DIAGNOSIS — O0992 Supervision of high risk pregnancy, unspecified, second trimester: Secondary | ICD-10-CM | POA: Diagnosis not present

## 2023-10-23 DIAGNOSIS — O0990 Supervision of high risk pregnancy, unspecified, unspecified trimester: Secondary | ICD-10-CM

## 2023-10-23 DIAGNOSIS — Z3A15 15 weeks gestation of pregnancy: Secondary | ICD-10-CM | POA: Diagnosis not present

## 2023-10-23 DIAGNOSIS — E669 Obesity, unspecified: Secondary | ICD-10-CM

## 2023-10-23 DIAGNOSIS — Z113 Encounter for screening for infections with a predominantly sexual mode of transmission: Secondary | ICD-10-CM | POA: Diagnosis present

## 2023-10-23 DIAGNOSIS — O99210 Obesity complicating pregnancy, unspecified trimester: Secondary | ICD-10-CM

## 2023-10-23 DIAGNOSIS — Z1379 Encounter for other screening for genetic and chromosomal anomalies: Secondary | ICD-10-CM

## 2023-10-23 MED ORDER — ASPIRIN 81 MG PO TBEC: 81.0000 mg | DELAYED_RELEASE_TABLET | Freq: Every day | ORAL | 3 refills | Status: DC

## 2023-10-23 NOTE — Telephone Encounter (Signed)
 traige voicemail radiology Estimated fetal weight 25th percentile.Marginal placenta previa. I let midwife jess A cnm aware

## 2023-10-24 LAB — HCV INTERPRETATION

## 2023-10-24 LAB — COMPREHENSIVE METABOLIC PANEL WITH GFR
ALT: 11 IU/L (ref 0–32)
AST: 13 IU/L (ref 0–40)
Albumin: 4.1 g/dL (ref 4.0–5.0)
Alkaline Phosphatase: 51 IU/L (ref 44–121)
BUN/Creatinine Ratio: 9 (ref 9–23)
BUN: 4 mg/dL — ABNORMAL LOW (ref 6–20)
Bilirubin Total: 0.3 mg/dL (ref 0.0–1.2)
CO2: 18 mmol/L — ABNORMAL LOW (ref 20–29)
Calcium: 9 mg/dL (ref 8.7–10.2)
Chloride: 101 mmol/L (ref 96–106)
Creatinine, Ser: 0.45 mg/dL — ABNORMAL LOW (ref 0.57–1.00)
Globulin, Total: 2.3 g/dL (ref 1.5–4.5)
Glucose: 76 mg/dL (ref 70–99)
Potassium: 4 mmol/L (ref 3.5–5.2)
Sodium: 136 mmol/L (ref 134–144)
Total Protein: 6.4 g/dL (ref 6.0–8.5)
eGFR: 139 mL/min/1.73 (ref >59)

## 2023-10-24 LAB — T4F: T4,Free (Direct): 1 ng/dL (ref 0.82–1.77)

## 2023-10-24 LAB — CBC/D/PLT+RPR+RH+ABO+RUBIGG...
Antibody Screen: NEGATIVE
Basophils Absolute: 0 x10E3/uL (ref 0.0–0.2)
Basos: 0 %
EOS (ABSOLUTE): 0.1 x10E3/uL (ref 0.0–0.4)
Eos: 1 %
HCV Ab: NONREACTIVE
HIV Screen 4th Generation wRfx: NONREACTIVE
Hematocrit: 41.4 % (ref 34.0–46.6)
Hemoglobin: 13.7 g/dL (ref 11.1–15.9)
Hepatitis B Surface Ag: NEGATIVE
Immature Grans (Abs): 0 x10E3/uL (ref 0.0–0.1)
Immature Granulocytes: 0 %
Lymphocytes Absolute: 1.3 x10E3/uL (ref 0.7–3.1)
Lymphs: 19 %
MCH: 30 pg (ref 26.6–33.0)
MCHC: 33.1 g/dL (ref 31.5–35.7)
MCV: 91 fL (ref 79–97)
Monocytes Absolute: 0.4 x10E3/uL (ref 0.1–0.9)
Monocytes: 6 %
Neutrophils Absolute: 5.2 x10E3/uL (ref 1.4–7.0)
Neutrophils: 74 %
Platelets: 286 x10E3/uL (ref 150–450)
RBC: 4.56 x10E6/uL (ref 3.77–5.28)
RDW: 12.5 % (ref 11.7–15.4)
RPR Ser Ql: NONREACTIVE
Rh Factor: POSITIVE
Rubella Antibodies, IGG: <0.9 {index} — ABNORMAL LOW (ref >0.99)
Varicella zoster IgG: NONREACTIVE
WBC: 7 x10E3/uL (ref 3.4–10.8)

## 2023-10-24 LAB — HEMOGLOBIN A1C
Est. average glucose Bld gHb Est-mCnc: 103 mg/dL
Hgb A1c MFr Bld: 5.2 % (ref 4.8–5.6)

## 2023-10-24 LAB — TSH RFX ON ABNORMAL TO FREE T4: TSH: 0.408 u[IU]/mL — ABNORMAL LOW (ref 0.450–4.500)

## 2023-10-25 LAB — CULTURE, OB URINE

## 2023-10-25 LAB — CERVICOVAGINAL ANCILLARY ONLY
Bacterial Vaginitis (gardnerella): POSITIVE — AB
Candida Glabrata: NEGATIVE
Candida Vaginitis: POSITIVE — AB
Chlamydia: NEGATIVE
Comment: NEGATIVE
Comment: NEGATIVE
Comment: NEGATIVE
Comment: NEGATIVE
Comment: NEGATIVE
Comment: NORMAL
Neisseria Gonorrhea: NEGATIVE
Trichomonas: NEGATIVE

## 2023-10-25 LAB — URINE CULTURE, OB REFLEX

## 2023-10-26 ENCOUNTER — Other Ambulatory Visit: Payer: Self-pay

## 2023-10-26 ENCOUNTER — Ambulatory Visit: Payer: Self-pay

## 2023-10-26 DIAGNOSIS — B9689 Other specified bacterial agents as the cause of diseases classified elsewhere: Secondary | ICD-10-CM

## 2023-10-26 MED ORDER — METRONIDAZOLE 500 MG PO TABS: 500.0000 mg | ORAL_TABLET | Freq: Two times a day (BID) | ORAL | 0 refills | Status: DC

## 2023-10-28 LAB — MATERNIT 21 PLUS CORE, BLOOD
Fetal Fraction: 25
Result (T21): NEGATIVE
Trisomy 13 (Patau syndrome): NEGATIVE
Trisomy 18 (Edwards syndrome): NEGATIVE
Trisomy 21 (Down syndrome): NEGATIVE

## 2023-11-19 NOTE — Progress Notes (Unsigned)
    Return Prenatal Note   Subjective  23 y.o. [id number] at [redacted]w[redacted]d presents for this follow-up prenatal visit.  Patient ***  Patient reports:   Denies vaginal bleeding or leaking fluid. Objective  Flow sheet Vitals:   Total weight gain: -10 lb 11.2 oz (-4.853 kg)  General Appearance  No acute distress, well appearing, and well nourished Pulmonary   Normal work of breathing Neurologic   Alert and oriented to person, place, and time Psychiatric   Mood and affect within normal limits   Assessment/Plan   Plan  23 y.o. [id number] at [redacted]w[redacted]d presents for follow-up OB visit. Reviewed prenatal record including previous visit note. There are no diagnoses linked to this encounter.  No problem-specific Assessment & Plan notes found for this encounter.    No orders of the defined types were placed in this encounter.  No follow-ups on file.   Future Appointments  Date Time Provider Department Center  11/20/2023  3:00 PM [facility] US  1 [facility] None  11/20/2023  3:55 PM Leigh Sober, MD [facility] None    For next visit:  {SJFprenatalcare:29716}      Sober Leigh, DO [facility]

## 2023-11-20 ENCOUNTER — Other Ambulatory Visit: Payer: Self-pay

## 2023-11-20 ENCOUNTER — Ambulatory Visit

## 2023-11-20 ENCOUNTER — Ambulatory Visit (INDEPENDENT_AMBULATORY_CARE_PROVIDER_SITE_OTHER): Admitting: Obstetrics

## 2023-11-20 ENCOUNTER — Telehealth: Payer: Self-pay | Admitting: Obstetrics

## 2023-11-20 VITALS — BP 98/71 | HR 97 | Wt 197.7 lb

## 2023-11-20 DIAGNOSIS — O99212 Obesity complicating pregnancy, second trimester: Secondary | ICD-10-CM

## 2023-11-20 DIAGNOSIS — Z3A19 19 weeks gestation of pregnancy: Secondary | ICD-10-CM

## 2023-11-20 DIAGNOSIS — O23592 Infection of other part of genital tract in pregnancy, second trimester: Secondary | ICD-10-CM

## 2023-11-20 DIAGNOSIS — O0990 Supervision of high risk pregnancy, unspecified, unspecified trimester: Secondary | ICD-10-CM

## 2023-11-20 DIAGNOSIS — O09899 Supervision of other high risk pregnancies, unspecified trimester: Secondary | ICD-10-CM

## 2023-11-20 DIAGNOSIS — B379 Candidiasis, unspecified: Secondary | ICD-10-CM

## 2023-11-20 DIAGNOSIS — O99210 Obesity complicating pregnancy, unspecified trimester: Secondary | ICD-10-CM

## 2023-11-20 DIAGNOSIS — O09212 Supervision of pregnancy with history of pre-term labor, second trimester: Secondary | ICD-10-CM | POA: Diagnosis not present

## 2023-11-20 DIAGNOSIS — E669 Obesity, unspecified: Secondary | ICD-10-CM

## 2023-11-20 MED ORDER — MICONAZOLE 3 200 MG VA SUPP: 200.0000 mg | Freq: Every day | VAGINAL | 0 refills | Status: AC

## 2023-11-20 NOTE — Telephone Encounter (Signed)
 Pt was scheduled for US  on 11/20/2023.  Pt came late to the appt.  Gave number for Centralized Scheduling so pt can schedule the US  at her convenience.  We don't have availability here until around Oct. 23.  She needed an anatomy scan.

## 2023-11-20 NOTE — Addendum Note (Signed)
 Addended by: VICCI ROLLO BRAVO on: 11/20/2023 04:28 PM   Modules accepted: Orders

## 2023-11-20 NOTE — Progress Notes (Signed)
Order for anatomy scan placed

## 2023-11-21 LAB — URINALYSIS, ROUTINE W REFLEX MICROSCOPIC
Bilirubin, UA: NEGATIVE
Glucose, UA: NEGATIVE
Ketones, UA: NEGATIVE
Leukocytes,UA: NEGATIVE
Nitrite, UA: NEGATIVE
Protein,UA: NEGATIVE
RBC, UA: NEGATIVE
Specific Gravity, UA: 1.01 (ref 1.005–1.030)
Urobilinogen, Ur: 0.2 mg/dL (ref 0.2–1.0)
pH, UA: 6 (ref 5.0–7.5)

## 2023-11-21 LAB — PROTEIN / CREATININE RATIO, URINE
Creatinine, Urine: 57.6 mg/dL
Protein, Ur: 5.3 mg/dL
Protein/Creat Ratio: 92 mg/g{creat} (ref 0–200)

## 2023-11-22 ENCOUNTER — Ambulatory Visit
Admission: RE | Admit: 2023-11-22 | Discharge: 2023-11-22 | Disposition: A | Discharge status: Home / Self Care | Source: Ambulatory Visit | Attending: Obstetrics | Admitting: Obstetrics

## 2023-11-22 DIAGNOSIS — O0990 Supervision of high risk pregnancy, unspecified, unspecified trimester: Secondary | ICD-10-CM | POA: Insufficient documentation

## 2023-11-23 LAB — MONITOR DRUG PROFILE 14(MW)
Amphetamine Scrn, Ur: NEGATIVE ng/mL
BARBITURATE SCREEN URINE: NEGATIVE ng/mL
BENZODIAZEPINE SCREEN, URINE: NEGATIVE ng/mL
Buprenorphine, Urine: NEGATIVE ng/mL
Cocaine (Metab) Scrn, Ur: NEGATIVE ng/mL
Creatinine(Crt), U: 57.5 mg/dL (ref 20.0–300.0)
Fentanyl, Urine: NEGATIVE pg/mL
Meperidine Screen, Urine: NEGATIVE ng/mL
Methadone Screen, Urine: NEGATIVE ng/mL
OXYCODONE+OXYMORPHONE UR QL SCN: NEGATIVE ng/mL
Opiate Scrn, Ur: NEGATIVE ng/mL
Ph of Urine: 5.7 (ref 4.5–8.9)
Phencyclidine Qn, Ur: NEGATIVE ng/mL
Propoxyphene Scrn, Ur: NEGATIVE ng/mL
SPECIFIC GRAVITY: 1.01
Tramadol Screen, Urine: NEGATIVE ng/mL

## 2023-11-23 LAB — NICOTINE SCREEN, URINE: Cotinine Ql Scrn, Ur: NEGATIVE ng/mL

## 2023-11-23 LAB — CANNABINOID (GC/MS), URINE
Cannabinoid: POSITIVE — AB
Carboxy THC (GC/MS): 13 ng/mL

## 2023-11-29 ENCOUNTER — Ambulatory Visit: Payer: Self-pay | Admitting: Obstetrics

## 2023-12-18 ENCOUNTER — Encounter: Admitting: Obstetrics & Gynecology

## 2023-12-20 NOTE — Progress Notes (Unsigned)
    Return Prenatal Note   Subjective   23 y.o. H5E7896 at [redacted]w[redacted]d presents for this follow-up prenatal visit.  Patient doing ok, feels tired  Patient reports: She was treated for UTI about two weeks ago. She completed flagyl  for BV, but thinks it may have returned. She denies odor. Self Swab collected today.  -When discussing warning signs for preeclampsia, Kearra stated that she does get HA almost daily in the front that feels like throbbing, the AH sometimes last all day, other times it comes and goes, she tries to nap when they occur.  Movement: Present Contractions: Not present  Objective   Flow sheet Vitals: Pulse Rate: (!) 108 BP: 105/70 Fundal Height: 23 cm Fetal Heart Rate (bpm): 144 Total weight gain: 4 lb (1.814 kg)  General Appearance  No acute distress, well appearing, and well nourished Pulmonary   Normal work of breathing Neurologic   Alert and oriented to person, place, and time Psychiatric   Mood and affect within normal limits   Assessment/Plan   Plan  23 y.o. H5E7896 at [redacted]w[redacted]d presents for follow-up OB visit. Reviewed prenatal record including previous visit note.  Headache -discussed hydration, eating small frequent meals.  -may take magnesium , Co E Q1- and Riboflavin daily, info placed in AVS   Obesity in pregnancy TWG 4lbs, WNL   Supervision of high risk pregnancy, antepartum -Follow up US  ordered for incomplete views  -Aptima swab sent -Discussed GDM screening and TDAP next visit  -Collect urine at next visit for [facility]  -Does not desire future children, would like her partner to get a vasectomy. Plans to use birth control if he does not.  -Warning signs reviewed.       Orders Placed This Encounter  Procedures   US  OB Follow Up    Standing Status:   Future    Expected Date:   12/28/2023    Expiration Date:   12/20/2024    Reason for Exam (SYMPTOM  OR DIAGNOSIS REQUIRED):   f/u anatomy  spine, abdominal cord insertion site, and kidneys are not  seen. The cavum septi pellucidi, RVOT, and hands and feet are not well seen.    Preferred Imaging Location?:   [facility]    Release to patient:   Immediate   28 Week RH+Panel    Standing Status:   Future    Expected Date:   01/18/2024    Expiration Date:   12/20/2024    Release to patient:   Immediate [1]   Return in about 4 weeks (around 01/18/2024) for ROB, 28wk labs.   Future Appointments  Date Time Provider Department Center  01/04/2024  2:30 PM [facility]  4 [facility]  01/15/2024  9:00 AM [facility] None  01/15/2024  9:35 AM Jayne Harlene CROME, CNM [facility] None     For next visit:  ROB with 1 hour glucola, third trimester labs, and Tdap     JINNIE HERO [facility], CNM  12/21/2510:23 PM

## 2023-12-21 ENCOUNTER — Encounter: Payer: Self-pay | Admitting: Licensed Practical Nurse

## 2023-12-21 ENCOUNTER — Other Ambulatory Visit (HOSPITAL_COMMUNITY)
Admission: RE | Admit: 2023-12-21 | Discharge: 2023-12-21 | Disposition: A | Discharge status: Home / Self Care | Source: Ambulatory Visit | Attending: Licensed Practical Nurse | Admitting: Licensed Practical Nurse

## 2023-12-21 ENCOUNTER — Ambulatory Visit (INDEPENDENT_AMBULATORY_CARE_PROVIDER_SITE_OTHER): Admitting: Licensed Practical Nurse

## 2023-12-21 VITALS — BP 105/70 | HR 108 | Wt 201.0 lb

## 2023-12-21 DIAGNOSIS — O26892 Other specified pregnancy related conditions, second trimester: Secondary | ICD-10-CM

## 2023-12-21 DIAGNOSIS — O0990 Supervision of high risk pregnancy, unspecified, unspecified trimester: Secondary | ICD-10-CM

## 2023-12-21 DIAGNOSIS — E669 Obesity, unspecified: Secondary | ICD-10-CM

## 2023-12-21 DIAGNOSIS — O99212 Obesity complicating pregnancy, second trimester: Secondary | ICD-10-CM | POA: Diagnosis not present

## 2023-12-21 DIAGNOSIS — N898 Other specified noninflammatory disorders of vagina: Secondary | ICD-10-CM | POA: Diagnosis present

## 2023-12-21 DIAGNOSIS — Z131 Encounter for screening for diabetes mellitus: Secondary | ICD-10-CM

## 2023-12-21 DIAGNOSIS — Z3A24 24 weeks gestation of pregnancy: Secondary | ICD-10-CM

## 2023-12-21 DIAGNOSIS — R519 Headache, unspecified: Secondary | ICD-10-CM | POA: Insufficient documentation

## 2023-12-21 DIAGNOSIS — Z130 Encounter for screening for diseases of the blood and blood-forming organs and certain disorders involving the immune mechanism: Secondary | ICD-10-CM

## 2023-12-21 DIAGNOSIS — O99210 Obesity complicating pregnancy, unspecified trimester: Secondary | ICD-10-CM

## 2023-12-21 DIAGNOSIS — Z362 Encounter for other antenatal screening follow-up: Secondary | ICD-10-CM

## 2023-12-21 DIAGNOSIS — Z113 Encounter for screening for infections with a predominantly sexual mode of transmission: Secondary | ICD-10-CM

## 2023-12-21 NOTE — Assessment & Plan Note (Signed)
-  discussed hydration, eating small frequent meals.  -may take magnesium , Co E Q1- and Riboflavin daily, info placed in AVS

## 2023-12-21 NOTE — Patient Instructions (Addendum)
 To prevent Headache:  Magnesium  500 mg daily B2 400 mg daily Coenzyme q10 150 mg daily   Oral Glucose Tolerance Test During Pregnancy Why am I having this test? The oral glucose tolerance test (OGTT) is done to check how your body uses blood sugar, also called glucose. It's one of many tests used to diagnose the type of diabetes you can get while pregnant. This type of diabetes is called gestational diabetes mellitus (GDM). You may get GDM during the middle part of your pregnancy. In most cases, it goes away after you give birth. Most people get tested for GDM around weeks 24-28 of pregnancy. You may have the test sooner if: You or your mother had diabetes while pregnant. A person in your family has diabetes. You're having more than one baby this pregnancy. You've had a baby before who weighed more than 9 pounds (4 kg) at birth. You have high blood pressure or heart disease. You have a large body. You're not active. What is being tested? This test measures your blood sugar at different times. It shows how well your body uses the sugar in your blood. What kind of sample is taken?  A sample of blood is needed for this test. The sample is taken by putting a needle into a blood vessel. How do I prepare for this test? Eat your normal meals the day before the test. Your health care provider will tell you about: Eating or drinking on the day of the test. You may need to fast for 8-10 hours before the test. When you fast, you can only have water. Changing or stopping your regular medicines. Some medicines may affect your test results. Tell a health care provider about: All medicines you take. These include vitamins, herbs, eye drops, and creams. What happens during the test? The test involves these steps: Your blood sugar will be checked. It's called your fasting blood sugar if you fasted before the test. You'll drink a sugary mixture. Your blood sugar will be checked again. For a 1-hour  test, it will be checked after an hour. For a 3-hour test, it will be checked 1, 2, and 3 hours after you drink the sugary mixture. The test takes 1-3 hours. You'll need to stay at the testing place during this time. During the testing time: Do not eat or drink anything after the sugary drink. Do not exercise. Do not use any products that contain nicotine  or tobacco. These products include cigarettes, chewing tobacco, and vaping devices, such as e-cigarettes. The test may vary among providers and hospitals. How are the results reported? Your provider will compare your results to normal values for the kind of test that you had done. You may need to call or meet with your provider to get your results. What do the results mean? Your provider can tell you what blood sugar levels are normal for the test you're doing. If two or more of your blood sugar levels are at or above normal, you may be diagnosed with GDM. If only one level is high, your provider may suggest: Doing the test again. Doing other tests to confirm a diagnosis. Talk with your provider about what your results mean. Questions to ask your health care provider Ask your provider, or the department doing the test: When will my results be ready? How will I get my results? What are my next steps? This information is not intended to replace advice given to you by your health care provider. Make sure you  discuss any questions you have with your health care provider. Document Revised: 06/12/2022 Document Reviewed: 06/12/2022 Elsevier Patient Education  2024 [employer].

## 2023-12-21 NOTE — Assessment & Plan Note (Signed)
-  Follow up US  ordered for incomplete views  -Aptima swab sent -Discussed GDM screening and TDAP next visit  -Collect urine at next visit for [facility]  -Does not desire future children, would like her partner to get a vasectomy. Plans to use birth control if he does not.  -Warning signs reviewed.

## 2023-12-21 NOTE — Assessment & Plan Note (Signed)
 TWG 4lbs, WNL

## 2023-12-25 LAB — CERVICOVAGINAL ANCILLARY ONLY
Bacterial Vaginitis (gardnerella): POSITIVE — AB
Candida Glabrata: NEGATIVE
Candida Vaginitis: NEGATIVE
Chlamydia: NEGATIVE
Comment: NEGATIVE
Comment: NEGATIVE
Comment: NEGATIVE
Comment: NEGATIVE
Comment: NEGATIVE
Comment: NORMAL
Neisseria Gonorrhea: NEGATIVE
Trichomonas: NEGATIVE

## 2023-12-26 ENCOUNTER — Other Ambulatory Visit: Payer: Self-pay

## 2023-12-26 DIAGNOSIS — B9689 Other specified bacterial agents as the cause of diseases classified elsewhere: Secondary | ICD-10-CM

## 2023-12-26 MED ORDER — METRONIDAZOLE 500 MG PO TABS: 500.0000 mg | ORAL_TABLET | Freq: Two times a day (BID) | ORAL | 0 refills | Status: DC

## 2023-12-29 ENCOUNTER — Ambulatory Visit: Payer: Self-pay | Admitting: Licensed Practical Nurse

## 2024-01-04 ENCOUNTER — Ambulatory Visit
Admission: RE | Admit: 2024-01-04 | Discharge: 2024-01-04 | Disposition: A | Discharge status: Home / Self Care | Source: Ambulatory Visit | Attending: Licensed Practical Nurse | Admitting: Licensed Practical Nurse

## 2024-01-04 DIAGNOSIS — Z362 Encounter for other antenatal screening follow-up: Secondary | ICD-10-CM | POA: Diagnosis present

## 2024-01-14 NOTE — Progress Notes (Unsigned)
    Return Prenatal Note   Subjective   23 y.o. H5E7896 at [redacted]w[redacted]d presents for this follow-up prenatal visit.  Patient requesting refill of metronidazole  due to vaginal burning. She declines exam, self swab collected. Reports that the burning is random, denies dysuria, reports one episode of increased vaginal discharge with odor about 2 weeks ago. Denies aggravating or alleviating factors for the burning.  Patient reports: Movement: Present Contractions: Not present  Objective   Flow sheet Vitals: Pulse Rate: (!) 109 BP: 124/77 Fundal Height: 30 cm Fetal Heart Rate (bpm): 140 Total weight gain: 6 lb 14.4 oz (3.13 kg)  General Appearance  No acute distress, well appearing, and well nourished Pulmonary   Normal work of breathing Neurologic   Alert and oriented to person, place, and time Psychiatric   Mood and affect within normal limits   Assessment/Plan   Plan  23 y.o. H5E7896 at [redacted]w[redacted]d presents for follow-up OB visit. Reviewed prenatal record including previous visit note.  Obesity in pregnancy Growth ultrasound ordered.  Supervision of high risk pregnancy, antepartum 28 week labs today. Discussed indications for administration of blood products, requested she consider her desires for emergent treatment vs her mother's. Reviewed kick counts and preterm labor warning signs. Instructed to call office or come to hospital with persistent headache, vision changes, regular contractions, leaking of fluid, decreased fetal movement or vaginal bleeding.      Orders Placed This Encounter  Procedures   US  OB Follow Up    Standing Status:   Future    Expected Date:   01/29/2024    Expiration Date:   01/14/2025    Reason for Exam (SYMPTOM  OR DIAGNOSIS REQUIRED):   growth    Preferred Imaging Location?:   Internal   Return in 2 weeks (on 01/29/2024) for ROB & Ultrasound.   Future Appointments  Date Time Provider Department Center  01/28/2024  4:30 PM [facility] US  1 [facility] None   01/29/2024 10:35 AM Dove, Myra C, MD [facility] None    For next visit:  continue with routine prenatal care     Harlene LITTIE Cisco, CNM  11/25/251:38 PM

## 2024-01-14 NOTE — Patient Instructions (Incomplete)
 Third Trimester of Pregnancy  The third trimester of pregnancy is from week 28 through week 40. This is months 7 through 9. The third trimester is a time when your baby is growing fast. Body changes during your third trimester Your body continues to change during this time. The changes usually go away after your baby is born. Physical changes You will continue to gain weight. You may get stretch marks on your hips, belly, and breasts. Your breasts will keep growing and may hurt. A yellow fluid (colostrum) may leak from your breasts. This is the first milk you're making for your baby. Your hair may grow faster and get thicker. In some cases, you may get hair loss. Your belly button may stick out. You may have more swelling in your hands, face, or ankles. Health changes You may have heartburn. You may feel short of breath. This is caused by the uterus that is now bigger. You may have more aches in the pelvis, back, or thighs. You may have more tingling or numbness in your hands, arms, and legs. You may pee more often. You may have trouble pooping (constipation) or swollen veins in the butt that can itch or get painful (hemorrhoids). Other changes You may have more problems sleeping. You may notice the baby moving lower in your belly (dropping). You may have more fluid coming from your vagina. Your joints may feel loose, and you may have pain around your pelvic bone. Follow these instructions at home: Medicines Take medicines only as told by your health care provider. Some medicines are not safe during pregnancy. Your provider may change the medicines that you take. Do not take any medicines unless told to by your provider. Take a prenatal vitamin that has at least 600 micrograms (mcg) of folic acid. Do not use herbal medicines, illegal drugs, or medicines that are not approved by your provider. Eating and drinking While you're pregnant your body needs additional nutrition to help  support your growing baby. Talk with your provider about your nutritional needs. Activity Most women are able to exercise regularly during pregnancy. Exercise routines may need to change at the end of your pregnancy. Talk to your provider about your activities and exercise routine. Relieving pain and discomfort Rest often with your legs raised if you have leg cramps or low back pain. Take warm sitz baths to soothe pain from hemorrhoids. Use hemorrhoid cream if your provider says it's okay. Wear a good, supportive bra if your breasts hurt. Do not use hot tubs, steam rooms, or saunas. Do not douche. Do not use tampons or scented pads. Safety Talk to your provider before traveling far distances. Wear your seatbelt at all times when you're in a car. Talk to your provider if someone hits you, hurts you, or yells at you. Preparing for birth To prepare for your baby: Take childbirth and breastfeeding classes. Visit the hospital and tour the maternity area. Buy a rear-facing car seat. Learn how to install it in your car. General instructions Avoid cat litter boxes and soil used by cats. These things carry germs that can cause harm to your pregnancy and your baby. Do not drink alcohol, smoke, vape, or use products with nicotine or tobacco in them. If you need help quitting, talk with your provider. Keep all follow-up visits for your third trimester. Your provider will do more exams and tests during this trimester. Write down your questions. Take them to your prenatal visits. Your provider also will: Talk with you about  your overall health. Give you advice or refer you to specialists who can help with different needs, including: Mental health and counseling. Foods and healthy eating. Ask for help if you need help with food. Where to find more information American Pregnancy Association: americanpregnancy.org [employer]: acog.org Office on [employer] Health:  [employer] Contact a health care provider if: You have a headache that does not go away when you take medicine. You have any of these problems: You can't eat or drink. You have nausea and vomiting. You have watery poop (diarrhea) for 2 days or more. You have pain when you pee, or your pee smells bad. You have been sick for 2 days or more and aren't getting better. Contact your provider right away if: You have any of these coming from your vagina: Abnormal discharge. Bad-smelling fluid. Bleeding. Your baby is moving less than usual. You have signs of labor: You have any contractions, belly cramping, or have pain in your pelvis or lower back before 37 weeks of pregnancy (preterm labor). You have regular contractions that are less than 5 minutes apart. Your water breaks. You have symptoms of high blood pressure or preeclampsia. These include: A severe, throbbing headache that does not go away. Sudden or extreme swelling of your face, hands, legs, or feet. Vision problems: You see spots. You have blurry vision. Your eyes are sensitive to light. If you can't reach your provider, go to an urgent care or emergency room. Get help right away if: You faint, become confused, or can't think clearly. You have chest pain or trouble breathing. You have any kind of injury, such as from a fall or a car crash. These symptoms may be an emergency. Call 911 right away. Do not wait to see if the symptoms will go away. Do not drive yourself to the hospital. This information is not intended to replace advice given to you by your health care provider. Make sure you discuss any questions you have with your health care provider. Document Revised: 11/09/2022 Document Reviewed: 06/09/2022 Elsevier Patient Education  2024 [employer].

## 2024-01-15 ENCOUNTER — Other Ambulatory Visit (HOSPITAL_COMMUNITY)
Admission: RE | Admit: 2024-01-15 | Discharge: 2024-01-15 | Disposition: A | Discharge status: Home / Self Care | Source: Ambulatory Visit | Attending: Certified Nurse Midwife | Admitting: Certified Nurse Midwife

## 2024-01-15 ENCOUNTER — Other Ambulatory Visit

## 2024-01-15 ENCOUNTER — Ambulatory Visit: Admitting: Certified Nurse Midwife

## 2024-01-15 VITALS — BP 124/77 | HR 109 | Wt 203.9 lb

## 2024-01-15 DIAGNOSIS — Z131 Encounter for screening for diabetes mellitus: Secondary | ICD-10-CM

## 2024-01-15 DIAGNOSIS — Z3A27 27 weeks gestation of pregnancy: Secondary | ICD-10-CM | POA: Diagnosis not present

## 2024-01-15 DIAGNOSIS — Z3482 Encounter for supervision of other normal pregnancy, second trimester: Secondary | ICD-10-CM

## 2024-01-15 DIAGNOSIS — E669 Obesity, unspecified: Secondary | ICD-10-CM | POA: Diagnosis not present

## 2024-01-15 DIAGNOSIS — O26892 Other specified pregnancy related conditions, second trimester: Secondary | ICD-10-CM | POA: Insufficient documentation

## 2024-01-15 DIAGNOSIS — N898 Other specified noninflammatory disorders of vagina: Secondary | ICD-10-CM

## 2024-01-15 DIAGNOSIS — Z23 Encounter for immunization: Secondary | ICD-10-CM

## 2024-01-15 DIAGNOSIS — O99210 Obesity complicating pregnancy, unspecified trimester: Secondary | ICD-10-CM

## 2024-01-15 DIAGNOSIS — R102 Pelvic and perineal pain unspecified side: Secondary | ICD-10-CM | POA: Diagnosis present

## 2024-01-15 DIAGNOSIS — Z113 Encounter for screening for infections with a predominantly sexual mode of transmission: Secondary | ICD-10-CM

## 2024-01-15 DIAGNOSIS — Z130 Encounter for screening for diseases of the blood and blood-forming organs and certain disorders involving the immune mechanism: Secondary | ICD-10-CM

## 2024-01-15 DIAGNOSIS — O0990 Supervision of high risk pregnancy, unspecified, unspecified trimester: Secondary | ICD-10-CM

## 2024-01-15 DIAGNOSIS — O99212 Obesity complicating pregnancy, second trimester: Secondary | ICD-10-CM

## 2024-01-15 NOTE — Assessment & Plan Note (Signed)
 28 week labs today. Discussed indications for administration of blood products, requested she consider her desires for emergent treatment vs her mother's. Reviewed kick counts and preterm labor warning signs. Instructed to call office or come to hospital with persistent headache, vision changes, regular contractions, leaking of fluid, decreased fetal movement or vaginal bleeding.

## 2024-01-15 NOTE — Assessment & Plan Note (Signed)
Growth ultrasound ordered.

## 2024-01-16 LAB — CERVICOVAGINAL ANCILLARY ONLY
Bacterial Vaginitis (gardnerella): NEGATIVE
Candida Glabrata: NEGATIVE
Candida Vaginitis: NEGATIVE
Chlamydia: NEGATIVE
Comment: NEGATIVE
Comment: NEGATIVE
Comment: NEGATIVE
Comment: NEGATIVE
Comment: NEGATIVE
Comment: NORMAL
Neisseria Gonorrhea: NEGATIVE
Trichomonas: NEGATIVE

## 2024-01-16 LAB — 28 WEEK RH+PANEL
Basophils Absolute: 0 x10E3/uL (ref 0.0–0.2)
Basos: 0 %
EOS (ABSOLUTE): 0 x10E3/uL (ref 0.0–0.4)
Eos: 1 %
Gestational Diabetes Screen: 136 mg/dL (ref 70–139)
HIV Screen 4th Generation wRfx: NONREACTIVE
Hematocrit: 37.5 % (ref 34.0–46.6)
Hemoglobin: 12.3 g/dL (ref 11.1–15.9)
Immature Grans (Abs): 0.1 x10E3/uL (ref 0.0–0.1)
Immature Granulocytes: 1 %
Lymphocytes Absolute: 1.4 x10E3/uL (ref 0.7–3.1)
Lymphs: 18 %
MCH: 29.8 pg (ref 26.6–33.0)
MCHC: 32.8 g/dL (ref 31.5–35.7)
MCV: 91 fL (ref 79–97)
Monocytes Absolute: 0.3 x10E3/uL (ref 0.1–0.9)
Monocytes: 4 %
Neutrophils Absolute: 5.6 x10E3/uL (ref 1.4–7.0)
Neutrophils: 76 %
Platelets: 264 x10E3/uL (ref 150–450)
RBC: 4.13 x10E6/uL (ref 3.77–5.28)
RDW: 13 % (ref 11.7–15.4)
RPR Ser Ql: NONREACTIVE
WBC: 7.4 x10E3/uL (ref 3.4–10.8)

## 2024-01-19 ENCOUNTER — Ambulatory Visit: Payer: Self-pay | Admitting: Certified Nurse Midwife

## 2024-01-28 ENCOUNTER — Other Ambulatory Visit

## 2024-01-29 ENCOUNTER — Encounter: Admitting: Obstetrics & Gynecology

## 2024-02-08 ENCOUNTER — Other Ambulatory Visit

## 2024-02-08 DIAGNOSIS — O99213 Obesity complicating pregnancy, third trimester: Secondary | ICD-10-CM | POA: Diagnosis not present

## 2024-02-08 DIAGNOSIS — Z3A31 31 weeks gestation of pregnancy: Secondary | ICD-10-CM

## 2024-02-08 DIAGNOSIS — O0993 Supervision of high risk pregnancy, unspecified, third trimester: Secondary | ICD-10-CM

## 2024-02-08 DIAGNOSIS — O99210 Obesity complicating pregnancy, unspecified trimester: Secondary | ICD-10-CM

## 2024-02-08 DIAGNOSIS — O0990 Supervision of high risk pregnancy, unspecified, unspecified trimester: Secondary | ICD-10-CM

## 2024-02-08 NOTE — Progress Notes (Signed)
" ° ° °  Return Prenatal Note   Subjective   23 y.o. H5E7896 at [redacted]w[redacted]d  presents for this follow-up prenatal visit.  Patient  Patient reports: has pain in left arm/shoulder, hurts all day is worse when she lays down, massages it, has been going on for a month, the pain does travel down the arm sometimes, denies numbness or tingling, does not have the pain now..  Currently not working, does a lot of house work but no repetitive movements or heavy lifting  -having braxton hicks nothing consistent  -mood is good, just tired -continues to get pain  in left temple daily has not tried supplements as previously discussed   Movement: Present Contractions: Irritability  Objective   Flow sheet Vitals: Pulse Rate: (!) 109 BP: 110/63 Fundal Height: 34 cm Fetal Heart Rate (bpm): 140 Total weight gain: 12 lb 6.4 oz (5.625 kg)  General Appearance  No acute distress, well appearing, and well nourished Pulmonary   Normal work of breathing Neurologic   Alert and oriented to person, place, and time Psychiatric   Mood and affect within normal limits   Assessment/Plan   Plan  23 y.o. H5E7896 at [redacted]w[redacted]d presents for follow-up OB visit. Reviewed prenatal record including previous visit note.  Obesity in pregnancy -ASA refilled -US  12/19 Growth is 49.2th percentile. AFI is 15.76 cm. s, - TWG 12lb  WNL   Supervision of high risk pregnancy, antepartum -comfort measures for shoulder pain reviewed, rec seeing PCP for this pain  -reminded to try Magnesium , Riboflavin and CO Q 10 for headaches  -warning signs reviewed       No orders of the defined types were placed in this encounter.  No follow-ups on file.   Future Appointments  Date Time Provider Department Center  02/25/2024  1:15 PM Jayne Harlene CROME, CNM [facility] None     For next visit:  continue with routine prenatal care     JINNIE HERO [facility], CNM  12/22/20252:22 PM  "

## 2024-02-08 NOTE — Patient Instructions (Incomplete)
 Third Trimester of Pregnancy  The third trimester of pregnancy is from week 28 through week 40. This is months 7 through 9. The third trimester is a time when your baby is growing fast. Body changes during your third trimester Your body continues to change during this time. The changes usually go away after your baby is born. Physical changes You will continue to gain weight. You may get stretch marks on your hips, belly, and breasts. Your breasts will keep growing and may hurt. A yellow fluid (colostrum) may leak from your breasts. This is the first milk you're making for your baby. Your hair may grow faster and get thicker. In some cases, you may get hair loss. Your belly button may stick out. You may have more swelling in your hands, face, or ankles. Health changes You may have heartburn. You may feel short of breath. This is caused by the uterus that is now bigger. You may have more aches in the pelvis, back, or thighs. You may have more tingling or numbness in your hands, arms, and legs. You may pee more often. You may have trouble pooping (constipation) or swollen veins in the butt that can itch or get painful (hemorrhoids). Other changes You may have more problems sleeping. You may notice the baby moving lower in your belly (dropping). You may have more fluid coming from your vagina. Your joints may feel loose, and you may have pain around your pelvic bone. Follow these instructions at home: Medicines Take medicines only as told by your health care provider. Some medicines are not safe during pregnancy. Your provider may change the medicines that you take. Do not take any medicines unless told to by your provider. Take a prenatal vitamin that has at least 600 micrograms (mcg) of folic acid. Do not use herbal medicines, illegal drugs, or medicines that are not approved by your provider. Eating and drinking While you're pregnant your body needs additional nutrition to help  support your growing baby. Talk with your provider about your nutritional needs. Activity Most women are able to exercise regularly during pregnancy. Exercise routines may need to change at the end of your pregnancy. Talk to your provider about your activities and exercise routine. Relieving pain and discomfort Rest often with your legs raised if you have leg cramps or low back pain. Take warm sitz baths to soothe pain from hemorrhoids. Use hemorrhoid cream if your provider says it's okay. Wear a good, supportive bra if your breasts hurt. Do not use hot tubs, steam rooms, or saunas. Do not douche. Do not use tampons or scented pads. Safety Talk to your provider before traveling far distances. Wear your seatbelt at all times when you're in a car. Talk to your provider if someone hits you, hurts you, or yells at you. Preparing for birth To prepare for your baby: Take childbirth and breastfeeding classes. Visit the hospital and tour the maternity area. Buy a rear-facing car seat. Learn how to install it in your car. General instructions Avoid cat litter boxes and soil used by cats. These things carry germs that can cause harm to your pregnancy and your baby. Do not drink alcohol, smoke, vape, or use products with nicotine or tobacco in them. If you need help quitting, talk with your provider. Keep all follow-up visits for your third trimester. Your provider will do more exams and tests during this trimester. Write down your questions. Take them to your prenatal visits. Your provider also will: Talk with you about  your overall health. Give you advice or refer you to specialists who can help with different needs, including: Mental health and counseling. Foods and healthy eating. Ask for help if you need help with food. Where to find more information American Pregnancy Association: americanpregnancy.org [employer]: acog.org Office on [employer] Health:  [employer] Contact a health care provider if: You have a headache that does not go away when you take medicine. You have any of these problems: You can't eat or drink. You have nausea and vomiting. You have watery poop (diarrhea) for 2 days or more. You have pain when you pee, or your pee smells bad. You have been sick for 2 days or more and aren't getting better. Contact your provider right away if: You have any of these coming from your vagina: Abnormal discharge. Bad-smelling fluid. Bleeding. Your baby is moving less than usual. You have signs of labor: You have any contractions, belly cramping, or have pain in your pelvis or lower back before 37 weeks of pregnancy (preterm labor). You have regular contractions that are less than 5 minutes apart. Your water breaks. You have symptoms of high blood pressure or preeclampsia. These include: A severe, throbbing headache that does not go away. Sudden or extreme swelling of your face, hands, legs, or feet. Vision problems: You see spots. You have blurry vision. Your eyes are sensitive to light. If you can't reach your provider, go to an urgent care or emergency room. Get help right away if: You faint, become confused, or can't think clearly. You have chest pain or trouble breathing. You have any kind of injury, such as from a fall or a car crash. These symptoms may be an emergency. Call 911 right away. Do not wait to see if the symptoms will go away. Do not drive yourself to the hospital. This information is not intended to replace advice given to you by your health care provider. Make sure you discuss any questions you have with your health care provider. Document Revised: 11/09/2022 Document Reviewed: 06/09/2022 Elsevier Patient Education  2024 [employer].

## 2024-02-11 ENCOUNTER — Ambulatory Visit: Admitting: Licensed Practical Nurse

## 2024-02-11 VITALS — BP 110/63 | HR 109 | Wt 209.4 lb

## 2024-02-11 DIAGNOSIS — E669 Obesity, unspecified: Secondary | ICD-10-CM | POA: Diagnosis not present

## 2024-02-11 DIAGNOSIS — O0993 Supervision of high risk pregnancy, unspecified, third trimester: Secondary | ICD-10-CM

## 2024-02-11 DIAGNOSIS — O0990 Supervision of high risk pregnancy, unspecified, unspecified trimester: Secondary | ICD-10-CM

## 2024-02-11 DIAGNOSIS — O99210 Obesity complicating pregnancy, unspecified trimester: Secondary | ICD-10-CM

## 2024-02-11 DIAGNOSIS — O99213 Obesity complicating pregnancy, third trimester: Secondary | ICD-10-CM | POA: Diagnosis not present

## 2024-02-11 DIAGNOSIS — Z3A31 31 weeks gestation of pregnancy: Secondary | ICD-10-CM

## 2024-02-11 MED ORDER — ASPIRIN 81 MG PO TBEC: 81.0000 mg | DELAYED_RELEASE_TABLET | Freq: Every day | ORAL | 3 refills | Status: AC

## 2024-02-11 NOTE — Assessment & Plan Note (Addendum)
-  comfort measures for shoulder pain reviewed, rec seeing PCP for this pain  -reminded to try Magnesium , Riboflavin and CO Q 10 for headaches  -warning signs reviewed

## 2024-02-11 NOTE — Assessment & Plan Note (Addendum)
-  ASA refilled -US  12/19 Growth is 49.2th percentile. AFI is 15.76 cm. s, - TWG 12lb  WNL

## 2024-02-25 ENCOUNTER — Encounter: Payer: Self-pay | Admitting: Certified Nurse Midwife

## 2024-02-25 ENCOUNTER — Ambulatory Visit: Admitting: Certified Nurse Midwife

## 2024-02-25 VITALS — BP 128/73 | HR 111 | Wt 211.7 lb

## 2024-02-25 DIAGNOSIS — R300 Dysuria: Secondary | ICD-10-CM

## 2024-02-25 DIAGNOSIS — O0990 Supervision of high risk pregnancy, unspecified, unspecified trimester: Secondary | ICD-10-CM

## 2024-02-25 DIAGNOSIS — O26893 Other specified pregnancy related conditions, third trimester: Secondary | ICD-10-CM

## 2024-02-25 DIAGNOSIS — Z531 Procedure and treatment not carried out because of patient's decision for reasons of belief and group pressure: Secondary | ICD-10-CM

## 2024-02-25 DIAGNOSIS — Z3A33 33 weeks gestation of pregnancy: Secondary | ICD-10-CM

## 2024-02-25 NOTE — Assessment & Plan Note (Signed)
 Consent form for refusal of blood products signed & witnessed today.

## 2024-02-25 NOTE — Patient Instructions (Addendum)
 RSV Vaccine: What You Need to Know Many vaccine information statements are available in Spanish and other languages. See classthemes.se. 1. Why get vaccinated? RSV vaccine can prevent lower respiratory tract disease caused by respiratory syncytial virus (RSV). RSV is a common respiratory virus that usually causes mild, cold-like symptoms. RSV can cause illness in people of all ages but may be especially serious for infants and older adults. RSV is the most common cause of hospitalization in [place] infants. Infants up to 63 months of age (especially those 6 months and younger) and children who were born prematurely, or who have chronic lung or heart disease, or a weakened immune system, are at increased risk of severe RSV disease. RSV infections can be dangerous for certain adults. Adults at highest risk for severe RSV disease include older adults, especially those with chronic heart or lung disease, a weakened immune system, certain other chronic medical conditions, or who live in nursing homes. RSV spreads through direct contact with the virus, such as when droplets from an infected person's cough or sneeze contact your eyes, nose, or mouth. It can also be spread by someone touching a surface, such as a doorknob, that has the virus on it, and then touching your face. Symptoms of RSV infection may include runny nose, decreased appetite, coughing, sneezing, fever, or wheezing. In very young infants, symptoms of RSV may also include irritability (fussiness), decreased activity, or apnea (pauses in breathing for more than 10 seconds). Most people recover in a week or two, but RSV can be more serious, resulting in shortness of breath and low oxygen levels. RSV can cause bronchiolitis (inflammation of the small airways in the lung) and pneumonia (infection of the lungs). RSV can also lead to worsening of other medical conditions such as asthma, chronic obstructive pulmonary disease (a chronic disease of the  lungs that makes it hard to breathe), or heart failure (when the heart cannot pump enough blood and oxygen throughout the body). Infants and older adults who get very sick from RSV may need to be hospitalized. Some may even die. 2. RSV vaccine There are two immunization options available for protecting infants against RSV: maternal vaccine for the pregnant person or preventive antibodies given to the baby. Only one of these options is needed for most babies to be protected. CDC recommends a one-time dose of RSV vaccine for pregnant people from week 32 through week 36 of pregnancy for the prevention of RSV disease in their infants during the first 6 months of life. This vaccine is recommended to be given from September through January for most of the [place] . However, in some locations (for example, the territories, [place] , [place] , and parts of [place] ), the timing of vaccination may differ based on the time of year when RSV circulates in the area. CDC recommends a one-time-dose of RSV vaccine for everyone 75 years and older and for adults 60 through 25 years of age who are at increased risk of severe RSV disease. Adults 63 through 24 years old who are at increased risk include those with chronic heart or lung disease, a weakened immune system, or certain other chronic medical conditions, and those who are residents of nursing homes. RSV vaccine may be given at the same time as other vaccines. 3. Talk with your health care provider Tell your vaccination provider if the person getting the vaccine: Has had an allergic reaction after a previous dose of RSV vaccine, or has any severe, life-threatening allergies In some cases, your health  care provider may decide to postpone RSV vaccination until a future visit.  People with minor illnesses, such as a cold, may be vaccinated. People who are moderately or severely ill should usually wait until they recover before getting RSV vaccine.  Your health care  provider can give you more information. 4. Risks of a vaccine reaction Pain, redness, and swelling where the shot is given, fatigue (feeling tired), fever, headache, nausea, diarrhea, and muscle or joint pain can happen after RSV vaccination. Serious neurologic conditions, including Guillain-Barr syndrome (GBS), have been reported after RSV vaccination in some older adults. At this time, an increased risk of GBS following RSV vaccine among persons aged 71 years and older cannot be confirmed or ruled out. Preterm birth and high blood pressure during pregnancy, including pre-eclampsia, have been reported among pregnant people who received RSV vaccine. It is unclear whether these events were caused by the vaccine. People sometimes faint after medical procedures, including vaccination. Tell your provider if you feel dizzy or have vision changes or ringing in the ears.  As with any medicine, there is a very remote chance of a vaccine causing a severe allergic reaction, other serious injury, or death. V-Safe is a safety monitoring system that lets you share with CDC how you, or your dependent, feel after getting RSV vaccine. You can find information and enroll in V-Safe radarlocations.no. 5. What if there is a serious problem? An allergic reaction could occur after the vaccinated person leaves the clinic. If you see signs of a severe allergic reaction (hives, swelling of the face and throat, difficulty breathing, a fast heartbeat, dizziness, or weakness), call 9-1-1 and get the person to the nearest hospital. For other signs that concern you, call your health care provider.  Adverse reactions should be reported to the Vaccine Adverse Event Reporting System (VAERS). Your health care provider will usually file this report, or you can do it yourself. Visit the VAERS website at www.vaers.lagents.no or call 512-794-5370. VAERS is only for reporting reactions, and VAERS staff members do not give medical advice. 6. How  can I learn more? Ask your health care provider. Call your local or state health department. Visit the website of the Food and Drug Administration (FDA) for vaccine package inserts and additional information at finderlist.no Contact the Centers for Disease Control and Prevention (CDC): Call (402)257-2720 (1-800-CDC-INFO) or Visit CDC's vaccine website at piccapture.uy Source: CDC Vaccine Information Statement RSV (Respiratory Syncytial Virus) Vaccine (12/07/2022) This same material is available at tonerpromos.no for no charge. This information is not intended to replace advice given to you by your health care provider. Make sure you discuss any questions you have with your health care provider. Document Revised: 01/02/2023 Document Reviewed: 02/22/2022 Elsevier Patient Education  2024 Else[employer] Inc.Influenza (Flu) Vaccine (Inactivated or Recombinant): What You Need to Know Many vaccine information statements are available in Spanish and other languages. See promoage.com.br. 1. Why get vaccinated? Influenza vaccine can prevent influenza (flu). Flu is a contagious disease that spreads around the [place]  every year, usually between October and May. Anyone can get the flu, but it is more dangerous for some people. Infants and young children, people 38 years and older, pregnant people, and people with certain health conditions or a weakened immune system are at greatest risk of flu complications. Pneumonia, bronchitis, sinus infections, and ear infections are examples of flu-related complications. If you have a medical condition, such as heart disease, cancer, or diabetes, flu can make it worse. Flu can cause fever  and chills, sore throat, muscle aches, fatigue, cough, headache, and runny or stuffy nose. Some people may have vomiting and diarrhea, though this is more common in children than adults. In an average year, thousands of people in the [place]   die from flu, and many more are hospitalized. Flu vaccine prevents millions of illnesses and flu-related visits to the doctor each year. 2. Influenza vaccines CDC recommends everyone 6 months and older get vaccinated every flu season. Children 6 months through 59 years of age may need 2 doses during a single flu season. Everyone else needs only 1 dose each flu season. It takes about 2 weeks for protection to develop after vaccination. There are many flu viruses, and they are always changing. Each year a new flu vaccine is made to protect against the influenza viruses believed to be likely to cause disease in the upcoming flu season. Even when the vaccine doesn't exactly match these viruses, it may still provide some protection. Influenza vaccine does not cause flu. Influenza vaccine may be given at the same time as other vaccines. 3. Talk with your health care provider Tell your vaccination provider if the person getting the vaccine: Has had an allergic reaction after a previous dose of influenza vaccine, or has any severe, life-threatening allergies Has ever had Guillain-Barr Syndrome (also called GBS) In some cases, your health care provider may decide to postpone influenza vaccination until a future visit. Influenza vaccine can be administered at any time during pregnancy. People who are or will be pregnant during influenza season should receive inactivated influenza vaccine. People with minor illnesses, such as a cold, may be vaccinated. People who are moderately or severely ill should usually wait until they recover before getting influenza vaccine. Your health care provider can give you more information. 4. Risks of a vaccine reaction Soreness, redness, and swelling where the shot is given, fever, muscle aches, and headache can happen after influenza vaccination. There may be a very small increased risk of Guillain-Barr Syndrome (GBS) after inactivated influenza vaccine (the flu  shot). Young children who get the flu shot along with pneumococcal vaccine (PCV13) and/or DTaP vaccine at the same time might be slightly more likely to have a seizure caused by fever. Tell your health care provider if a child who is getting flu vaccine has ever had a seizure. People sometimes faint after medical procedures, including vaccination. Tell your provider if you feel dizzy or have vision changes or ringing in the ears. As with any medicine, there is a very remote chance of a vaccine causing a severe allergic reaction, other serious injury, or death. 5. What if there is a serious problem? An allergic reaction could occur after the vaccinated person leaves the clinic. If you see signs of a severe allergic reaction (hives, swelling of the face and throat, difficulty breathing, a fast heartbeat, dizziness, or weakness), call 9-1-1 and get the person to the nearest hospital. For other signs that concern you, call your health care provider. Adverse reactions should be reported to the Vaccine Adverse Event Reporting System (VAERS). Your health care provider will usually file this report, or you can do it yourself. Visit the VAERS website at www.vaers.lagents.no or call (239) 484-3807. VAERS is only for reporting reactions, and VAERS staff members do not give medical advice. 6. The National Vaccine Injury Compensation Program The [employer] Vaccine Injury Compensation Program (VICP) is a federal program that was created to compensate people who may have been injured by certain vaccines. Claims regarding alleged  injury or death due to vaccination have a time limit for filing, which may be as short as two years. Visit the VICP website at spiritualword.at or call 908-471-9250 to learn about the program and about filing a claim. 7. How can I learn more? Ask your health care provider. Call your local or state health department. Visit the website of the Food and Drug Administration (FDA) for  vaccine package inserts and additional information at finderlist.no. Contact the Centers for Disease Control and Prevention (CDC): Call (870) 848-2072 (1-800-CDC-INFO) or Visit CDC's website at biotechroom.com.cy. Source: CDC Vaccine Information Statement Inactivated Influenza Vaccine (09/26/2019) This same material is available at footballexhibition.com.br for no charge. This information is not intended to replace advice given to you by your health care provider. Make sure you discuss any questions you have with your health care provider. Document Revised: 05/24/2022 Document Reviewed: 02/27/2022 Elsevier Patient Education  2024 Elsevier Inc.Group B Streptococcus Test During Pregnancy Why am I having this test? Routine testing, also called screening, for group B streptococcus (GBS) is recommended for all pregnant women between the 36th and 37th week of pregnancy. GBS is a type of bacteria that can be passed from mother to baby during childbirth. Screening will help guide whether or not you will need treatment during labor and delivery to prevent complications such as: An infection in your uterus during labor. An infection in your uterus after delivery. A serious infection in your baby after delivery, such as pneumonia, meningitis, or sepsis. GBS screening is not often done before 36 weeks of pregnancy unless you go into labor prematurely. What happens if I have group B streptococcus? If testing shows that you have GBS, your health care provider will recommend treatment with IV antibiotics during labor and delivery. This treatment significantly decreases the risk of complications for you and your baby. If you have a planned C-section and you have GBS, you may not need to be treated with antibiotics because GBS is usually passed to babies after labor starts and your water breaks. If you are in labor or your water breaks before your C-section, it is possible for GBS to get into your  uterus and be passed to your baby, so you might need treatment. Is there a chance I may not need to be tested? You may not need to be tested for GBS if: You have a urine test that shows GBS before 36 to 37 weeks. You had a baby with GBS infection after a previous delivery. In these cases, you will automatically be treated for GBS during labor and delivery. What is being tested? This test is done to check if you have group B streptococcus in your vagina or rectum. What kind of sample is taken? To collect samples for this test, your health care provider will swab your vagina and rectum with a cotton swab. The sample is then sent to the lab to see if GBS is present. What happens during the test?  You will remove your clothing from the waist down. You will lie down on an exam table in the same position as you would for a pelvic exam. Your health care provider will swab your vagina and rectum to collect samples for a culture test. You will be able to go home after the test and do all your usual activities. How are the results reported? The test results are reported as positive or negative. What do the results mean? A positive test means you are at risk for passing GBS to your  baby during labor and delivery. Your health care provider will recommend that you are treated with an IV antibiotic during labor and delivery. A negative test means you are at very low risk of passing GBS to your baby. There is still a low risk of passing GBS to your baby because sometimes test results may report that you do not have a condition when you do (false-negative result) or there is a chance that you may become infected with GBS after the test is done. You most likely will not need to be treated with an antibiotic during labor and delivery. Talk with your health care provider about what your results mean. Questions to ask your health care provider Ask your health care provider, or the department that is doing the  test: When will my results be ready? How will I get my results? What are my treatment options? Summary Routine testing (screening) for group B streptococcus (GBS) is recommended for all pregnant women between the 36th and 37th week of pregnancy. GBS is a type of bacteria that can be passed from mother to baby during childbirth. If testing shows that you have GBS, your health care provider will recommend that you are treated with IV antibiotics during labor and delivery. This treatment almost always prevents infection in newborns. This information is not intended to replace advice given to you by your health care provider. Make sure you discuss any questions you have with your health care provider. Document Revised: 01/23/2022 Document Reviewed: 01/23/2022 Elsevier Patient Education  2024 [employer].  Third Trimester of Pregnancy  The third trimester of pregnancy is from week 28 through week 40. This is months 7 through 9. The third trimester is a time when your baby is growing fast. Body changes during your third trimester Your body continues to change during this time. The changes usually go away after your baby is born. Physical changes You will continue to gain weight. You may get stretch marks on your hips, belly, and breasts. Your breasts will keep growing and may hurt. A yellow fluid (colostrum) may leak from your breasts. This is the first milk you're making for your baby. Your hair may grow faster and get thicker. In some cases, you may get hair loss. Your belly button may stick out. You may have more swelling in your hands, face, or ankles. Health changes You may have heartburn. You may feel short of breath. This is caused by the uterus that is now bigger. You may have more aches in the pelvis, back, or thighs. You may have more tingling or numbness in your hands, arms, and legs. You may pee more often. You may have trouble pooping (constipation) or swollen veins in the butt  that can itch or get painful (hemorrhoids). Other changes You may have more problems sleeping. You may notice the baby moving lower in your belly (dropping). You may have more fluid coming from your vagina. Your joints may feel loose, and you may have pain around your pelvic bone. Follow these instructions at home: Medicines Take medicines only as told by your health care provider. Some medicines are not safe during pregnancy. Your provider may change the medicines that you take. Do not take any medicines unless told to by your provider. Take a prenatal vitamin that has at least 600 micrograms (mcg) of folic acid. Do not use herbal medicines, illegal drugs, or medicines that are not approved by your provider. Eating and drinking While you're pregnant your body needs additional nutrition  to help support your growing baby. Talk with your provider about your nutritional needs. Activity Most women are able to exercise regularly during pregnancy. Exercise routines may need to change at the end of your pregnancy. Talk to your provider about your activities and exercise routine. Relieving pain and discomfort Rest often with your legs raised if you have leg cramps or low back pain. Take warm sitz baths to soothe pain from hemorrhoids. Use hemorrhoid cream if your provider says it's okay. Wear a good, supportive bra if your breasts hurt. Do not use hot tubs, steam rooms, or saunas. Do not douche. Do not use tampons or scented pads. Safety Talk to your provider before traveling far distances. Wear your seatbelt at all times when you're in a car. Talk to your provider if someone hits you, hurts you, or yells at you. Preparing for birth To prepare for your baby: Take childbirth and breastfeeding classes. Visit the hospital and tour the maternity area. Buy a rear-facing car seat. Learn how to install it in your car. General instructions Avoid cat litter boxes and soil used by cats. These things  carry germs that can cause harm to your pregnancy and your baby. Do not drink alcohol, smoke, vape, or use products with nicotine  or tobacco in them. If you need help quitting, talk with your provider. Keep all follow-up visits for your third trimester. Your provider will do more exams and tests during this trimester. Write down your questions. Take them to your prenatal visits. Your provider also will: Talk with you about your overall health. Give you advice or refer you to specialists who can help with different needs, including: Mental health and counseling. Foods and healthy eating. Ask for help if you need help with food. Where to find more information American Pregnancy Association: americanpregnancy.org [employer] of [employer]etricians and Gynecologists: acog.org Office on [employer] Health: travellesson.ca Contact a health care provider if: You have a headache that does not go away when you take medicine. You have any of these problems: You can't eat or drink. You have nausea and vomiting. You have watery poop (diarrhea) for 2 days or more. You have pain when you pee, or your pee smells bad. You have been sick for 2 days or more and aren't getting better. Contact your provider right away if: You have any of these coming from your vagina: Abnormal discharge. Bad-smelling fluid. Bleeding. Your baby is moving less than usual. You have signs of labor: You have any contractions, belly cramping, or have pain in your pelvis or lower back before 37 weeks of pregnancy (preterm labor). You have regular contractions that are less than 5 minutes apart. Your water breaks. You have symptoms of high blood pressure or preeclampsia. These include: A severe, throbbing headache that does not go away. Sudden or extreme swelling of your face, hands, legs, or feet. Vision problems: You see spots. You have blurry vision. Your eyes are sensitive to light. If you can't reach your provider, go to an  urgent care or emergency room. Get help right away if: You faint, become confused, or can't think clearly. You have chest pain or trouble breathing. You have any kind of injury, such as from a fall or a car crash. These symptoms may be an emergency. Call 911 right away. Do not wait to see if the symptoms will go away. Do not drive yourself to the hospital. This information is not intended to replace advice given to you by your health care provider. Make  sure you discuss any questions you have with your health care provider. Document Revised: 11/09/2022 Document Reviewed: 06/09/2022 Elsevier Patient Education  2024 Elsevier Inc. Problems to Watch for During Pregnancy During pregnancy, your body goes through many changes. Some changes may be uncomfortable. But most changes are not a serious problem. It's important to learn when certain signs and symptoms may be a problem. Talk with your health care provider about any medical conditions you have. Make sure you know the symptoms to watch for. Reporting problems early will prevent complications. Problems to watch for during pregnancy You're more likely to get an infection during pregnancy. Let your provider know if you have signs of infection, such as: A fever. A bad-smelling fluid from your vagina. Peeing too often, wanting to pee urgently, or pain when you pee. Also, let your provider know if: You're very tired, you feel dizzy, or you faint. You have watery poop (diarrhea) for 24 hours or longer. You throw up or feel like throwing up for 24 hours or longer. You have cramping in your belly or have pain in your hips or lower back. You have spotting, bleeding, or leaking of fluid from your vagina. You have pain, swelling, or redness in an arm or leg. You should also watch for signs of high blood pressure and preeclampsia. These signs can be very serious. They include: A headache that doesn't go away when you take medicine. Sudden or very bad  swelling of your face, hands, legs, or feet. Problems seeing, such as: You see spots. You have blurry vision. You may be sensitive to light. Why it's important to watch for these problems Watching and reporting problems to your provider can help prevent complications that may affect you and your baby. These include: Higher risk of giving birth early. Infection that may be passed on to your baby. Higher risk for stillbirth. Follow these instructions at home:  Take your medicines only as told. Keep all follow-up visits. Your provider needs to monitor your health and your baby's health. Where to find more information To learn more, go to these websites: Centers for Disease Control and Prevention (CDC) at tonerpromos.no. Then: Click Health Topics A-Z. Type urgent maternal warning signs in the search box. [employer] of [facility]stetricians and Gynecologists (ACOG): acog.org Contact a health care provider if: You have any problems while you're pregnant. You feel your baby moving less than usual. You have any of these things: You have strong emotions, such as sadness or anxiety, that affect your daily life. You do not feel safe in your home. You use tobacco, alcohol, or drugs, and you need help to stop. Get help right away if: You faint, have a seizure, or cannot think clearly. You have chest pain or difficulty breathing. You have any of the following symptoms and you were unable to reach your provider: You have symptoms of infection, including a fever, or have vaginal bleeding. You have symptoms of high blood pressure or preeclampsia. You have signs or symptoms of labor before 37 weeks of pregnancy. These include: Contractions that are 5 minutes or less apart, or that increase in frequency, intensity, or length. Sudden, sharp pain in the belly, or low back pain. Any amount of fluid that flows from your vagina without stopping. These symptoms may be an emergency. Call 911 right away. Do not  wait to see if the symptoms will go away. Do not drive yourself to the hospital. This information is not intended to replace advice given to you  by your health care provider. Make sure you discuss any questions you have with your health care provider. Document Revised: 07/18/2022 Document Reviewed: 07/18/2022 Elsevier Patient Education  2024 [employer].

## 2024-02-25 NOTE — Progress Notes (Signed)
" ° ° °  Return Prenatal Note   Subjective   24 y.o. H5E7896 at [redacted]w[redacted]d presents for this follow-up prenatal visit.  Patient feeling well. Declines blood products, will accept albumin, consent signed. Continues to have vaginal irritation/pain with urination at times. This is intermittent. Worse after IC. Patient reports: Movement: Present Contractions: Not present  Objective   Flow sheet Vitals: Pulse Rate: (!) 111 BP: 128/73 Fundal Height: 34 cm Fetal Heart Rate (bpm): 140 Presentation: Vertex Total weight gain: 14 lb 11.2 oz (6.668 kg)  General Appearance  No acute distress, well appearing, and well nourished Pulmonary   Normal work of breathing Neurologic   Alert and oriented to person, place, and time Psychiatric   Mood and affect within normal limits   Assessment/Plan   Plan  24 y.o. H5E7896 at [redacted]w[redacted]d presents for follow-up OB visit. Reviewed prenatal record including previous visit note.  Transfusion of blood product refused for religious reason Consent form for refusal of blood products signed & witnessed today.  Supervision of high risk pregnancy, antepartum Reviewed kick counts and preterm labor warning signs. Instructed to call office or come to hospital with persistent headache, vision changes, regular contractions, leaking of fluid, decreased fetal movement or vaginal bleeding.  Dysuria during pregnancy in third trimester Urine culture today. Recommend use of silicone based lubricant with IC, okay to use OTC miconazole  with symptoms but may be related to basic pH of seminal fluid and irritation due to IC in third trimester.      Orders Placed This Encounter  Procedures   Urine Culture   POCT Urinalysis Dipstick   Return in 2 weeks (on 03/10/2024) for ROB.   Future Appointments  Date Time Provider Department Center  03/10/2024  9:35 AM Justino Eleanor HERO, CNM [facility] None     For next visit:  continue with routine prenatal care     Harlene LITTIE Cisco,  CNM  1/5/20265:09 PM  "

## 2024-02-25 NOTE — Assessment & Plan Note (Signed)
 Reviewed kick counts and preterm labor warning signs. Instructed to call office or come to hospital with persistent headache, vision changes, regular contractions, leaking of fluid, decreased fetal movement or vaginal bleeding.

## 2024-02-25 NOTE — Assessment & Plan Note (Signed)
 Urine culture today. Recommend use of silicone based lubricant with IC, okay to use OTC miconazole  with symptoms but may be related to basic pH of seminal fluid and irritation due to IC in third trimester.

## 2024-02-27 LAB — URINE CULTURE

## 2024-02-29 ENCOUNTER — Ambulatory Visit: Payer: Self-pay | Admitting: Certified Nurse Midwife

## 2024-03-10 ENCOUNTER — Encounter: Admitting: Obstetrics

## 2024-03-10 DIAGNOSIS — O99210 Obesity complicating pregnancy, unspecified trimester: Secondary | ICD-10-CM

## 2024-03-10 DIAGNOSIS — Z3483 Encounter for supervision of other normal pregnancy, third trimester: Secondary | ICD-10-CM

## 2024-03-10 DIAGNOSIS — Z3689 Encounter for other specified antenatal screening: Secondary | ICD-10-CM

## 2024-03-10 DIAGNOSIS — Z3A35 35 weeks gestation of pregnancy: Secondary | ICD-10-CM

## 2024-03-10 DIAGNOSIS — Z6837 Body mass index (BMI) 37.0-37.9, adult: Secondary | ICD-10-CM

## 2024-03-14 ENCOUNTER — Encounter: Payer: Self-pay | Admitting: Registered Nurse

## 2024-03-14 ENCOUNTER — Ambulatory Visit: Admitting: Registered Nurse

## 2024-03-14 ENCOUNTER — Other Ambulatory Visit (HOSPITAL_COMMUNITY)
Admission: RE | Admit: 2024-03-14 | Discharge: 2024-03-14 | Disposition: A | Source: Ambulatory Visit | Attending: Registered Nurse | Admitting: Registered Nurse

## 2024-03-14 ENCOUNTER — Telehealth: Payer: Self-pay

## 2024-03-14 VITALS — BP 136/56 | HR 116 | Wt 210.0 lb

## 2024-03-14 DIAGNOSIS — O0990 Supervision of high risk pregnancy, unspecified, unspecified trimester: Secondary | ICD-10-CM

## 2024-03-14 DIAGNOSIS — Z113 Encounter for screening for infections with a predominantly sexual mode of transmission: Secondary | ICD-10-CM | POA: Diagnosis present

## 2024-03-14 DIAGNOSIS — O99513 Diseases of the respiratory system complicating pregnancy, third trimester: Secondary | ICD-10-CM

## 2024-03-14 DIAGNOSIS — Z3A36 36 weeks gestation of pregnancy: Secondary | ICD-10-CM | POA: Insufficient documentation

## 2024-03-14 DIAGNOSIS — Z3685 Encounter for antenatal screening for Streptococcus B: Secondary | ICD-10-CM | POA: Insufficient documentation

## 2024-03-14 DIAGNOSIS — J069 Acute upper respiratory infection, unspecified: Secondary | ICD-10-CM | POA: Diagnosis not present

## 2024-03-14 MED ORDER — OSELTAMIVIR PHOSPHATE 75 MG PO CAPS: 75.0000 mg | ORAL_CAPSULE | Freq: Two times a day (BID) | ORAL | 0 refills | Status: DC

## 2024-03-14 NOTE — Patient Instructions (Signed)
 Okay to take tylenol  for fever, ear pain, body aches. Afrin nasal spray twice a day up to three days for nasal congestion. Sudafed (ask the pharmacist for it) can be taken for nasal congestion and will likely also help ear pain. It may cause a temporary increase in blood pressure, so we don't want patients to take it longer than they need to. Please drink lots of warm fluids like tea. A spoonful of honey has been shown to be as effective as cough medicine at helping with a cough. It's okay to suck on cough drops if that is soothing for your cough and sore throat.

## 2024-03-14 NOTE — Assessment & Plan Note (Signed)
 Suspect flu given children + for flu. Discussed tx with Tamiflu . Also discussed other supportive care measures.

## 2024-03-14 NOTE — Telephone Encounter (Signed)
 Patient calling triage asking if she can come to her appointment today since her kids tested positive with FLU yesterday. She has a cough, sore throat, ear ache, but NO FEVER! Per Lauraine Lakes, she can come to appointment, NO KIDS, and has to be masked. Patient aware and will be in this afternoon.

## 2024-03-14 NOTE — Progress Notes (Signed)
" ° ° °  Return Prenatal Note   Subjective   24 y.o. H5E7896 at [redacted]w[redacted]d presents for this follow-up prenatal visit.  Patient has been sick for 2-3 weeks with URI sx (sore throat, cough, ear pain). Tried to get an appointment with Urgent Care, but they didn't have any available appointments. Children tested positive for flu yesterday. Has some right sided abdominal pain with coughing. Wants to know what she can take for her URI sx.  Patient reports: Movement: Present Contractions: Irregular  Objective   Flow sheet Vitals: Pulse Rate: (!) 116 BP: (!) 136/56 Fundal Height: 36 cm Fetal Heart Rate (bpm): 140 Presentation: Vertex Total weight gain: 13 lb (5.897 kg)  General Appearance  No acute distress, well appearing, and well nourished Pulmonary   Normal work of breathing. Lungs CTAB. CV    Well perfused. Reg rhythm, slightly tachycardic. Neurologic   Alert and oriented to person, place, and time Psychiatric   Mood and affect within normal limits   Assessment/Plan   Plan  24 y.o. H5E7896 at [redacted]w[redacted]d presents for follow-up OB visit. Reviewed prenatal record including previous visit note.  Supervision of high risk pregnancy, antepartum GBS, GC/CT today.  RTC 1 wk for ROB.   Upper respiratory infection Suspect flu given children + for flu. Discussed tx with Tamiflu - she would like to start that. Also discussed other supportive care measures.       Orders Placed This Encounter  Procedures   Culture, beta strep (group b only)   No follow-ups on file.   Future Appointments  Date Time Provider Department Center  03/21/2024  1:55 PM Lynda Bradley, CNM [facility] None    For next visit:  continue with routine prenatal care     Lauraine Lakes, CNM  03/14/24 2:10 PM  "

## 2024-03-14 NOTE — Assessment & Plan Note (Signed)
 GBS, GC/CT today.  RTC 1 wk for ROB.

## 2024-03-18 LAB — CULTURE, BETA STREP (GROUP B ONLY): Strep Gp B Culture: NEGATIVE

## 2024-03-19 ENCOUNTER — Ambulatory Visit: Payer: Self-pay | Admitting: Registered Nurse

## 2024-03-19 LAB — CERVICOVAGINAL ANCILLARY ONLY
Chlamydia: NEGATIVE
Comment: NEGATIVE
Comment: NORMAL
Neisseria Gonorrhea: NEGATIVE

## 2024-03-21 ENCOUNTER — Ambulatory Visit: Admitting: Advanced Practice Midwife

## 2024-03-21 ENCOUNTER — Encounter: Payer: Self-pay | Admitting: Advanced Practice Midwife

## 2024-03-21 VITALS — BP 108/65 | HR 116 | Wt 211.7 lb

## 2024-03-21 DIAGNOSIS — O99210 Obesity complicating pregnancy, unspecified trimester: Secondary | ICD-10-CM

## 2024-03-21 DIAGNOSIS — M791 Myalgia, unspecified site: Secondary | ICD-10-CM

## 2024-03-21 DIAGNOSIS — O99891 Other specified diseases and conditions complicating pregnancy: Secondary | ICD-10-CM

## 2024-03-21 DIAGNOSIS — E669 Obesity, unspecified: Secondary | ICD-10-CM | POA: Diagnosis not present

## 2024-03-21 DIAGNOSIS — O0993 Supervision of high risk pregnancy, unspecified, third trimester: Secondary | ICD-10-CM

## 2024-03-21 DIAGNOSIS — O99213 Obesity complicating pregnancy, third trimester: Secondary | ICD-10-CM | POA: Diagnosis not present

## 2024-03-21 DIAGNOSIS — O0990 Supervision of high risk pregnancy, unspecified, unspecified trimester: Secondary | ICD-10-CM

## 2024-03-21 DIAGNOSIS — Z3A37 37 weeks gestation of pregnancy: Secondary | ICD-10-CM

## 2024-03-21 MED ORDER — CYCLOBENZAPRINE HCL 10 MG PO TABS: 10.0000 mg | ORAL_TABLET | Freq: Three times a day (TID) | ORAL | 2 refills | Status: AC | PRN

## 2024-03-21 NOTE — Patient Instructions (Signed)
 Signs and Symptoms of Labor: Self-Care  Labor is your body's natural process of moving your baby and the placenta out of your uterus. The process of labor usually starts when the baby is full-term, between 82 and 41 weeks of pregnancy.  Signs and symptoms that you're close to going into labor  As your body gets ready for labor and the birth of your baby, you may notice some symptoms in the weeks and days before true labor starts. These may include:  A feeling of more pressure on your bladder and pelvic bone, but less pressure on your ribs.  This is your baby moving lower in your pelvis to get into position for birth.  This change may make it easier to breathe. It may also make you need to pee more often or have trouble pooping.  Passing a small amount of thick mucus from your vagina. This is your mucus plug.  This may happen more than a week before labor begins or right before labor begins, as the cervix, which is the lowest part of the uterus, starts to open or dilate. For some people, the entire mucus plug passes at once. For others, pieces of the mucus plug may slowly pass over a few days.  The mucus plug may be clear, pink, or blood-tinged. This is normal.  Having Braxton Hicks contractions. These are also called practice contractions.  They may feel like:  Tightening or pressure in your belly.  Mild cramps, similar to menstrual cramps.  These contractions:  Are common after exercise or sex.  Are usually mild and don't get stronger or closer together over time.  Are more than 10 minutes apart.  Happen at different times and don't follow a regular pattern.  Will stop if you change position, rest, or drink fluids.  Other early symptoms of labor include:  Having a sudden burst of energy or a desire to do things such as clean or go shopping. This is called nesting.  Loss of appetite or feeling like you may throw up.  Diarrhea, which is watery poop.  Mood changes.  Having trouble sleeping.  Signs and symptoms that  labor has begun  Signs that you're in labor may include:  Regular and stronger contractions.  Contractions come at regular intervals and get stronger over time.  They may feel like tightening or pressure starting in your back that moves to your belly.  They may also feel like rhythmic pain in your upper thighs or back.  If you're giving birth for the first time, contractions often take a long time to get stronger.  If you've given birth before, contractions may get stronger very quickly.  Vaginal pressure. A feeling of pressure in the vaginal area is common as labor progresses.  Water breaking.  The sac of fluid that surrounds the baby breaks, and the fluid leaks from the vagina.  The fluid may be clear or blood-tinged.  You may feel a sudden gush of fluid or may notice that you have damp underwear again and again.  Labor often starts within 24 hours of your water breaking, but it may take longer.  Cervical changes. Your cervix dilates as labor progresses. Your health care provider can check to see these changes.  Follow these instructions at home:    When labor starts, or if your water breaks, call your provider or nurse care line. They will tell you when you should go in for an exam.  During early labor, you may be able to  rest and manage symptoms at home. Some things to try at home include:  Breathing and relaxation techniques.  Taking a warm bath or shower.  Listening to music.  Using heat as told.  Use the heat source that your provider recommends, such as a moist heat pack or a heating pad. Do this as often as told.  Place a towel between your skin and the heat source.  Leave the heat on for 20-30 minutes.  If your skin turns red, take off the heat right away to prevent burns. The risk of burns is higher if you can't feel pain, heat, or cold.  Where to find more information  To learn more:  Go to [facility].  Type signs of labor in the search box.  Find the link you need.  Contact a health care  provider if:  You have a fever.  Your contractions are strong and regular.  You have lower back pain or cramping.  Your water breaks.  Get help right away if:  You have painful, regular contractions that are 5 minutes apart or less.  Labor starts before you're [redacted] weeks along in your pregnancy.  You have severe pain in your upper belly.  You have bright red blood coming from your vagina.  You don't feel your baby moving like normal.  You have a very bad headache with or without vision problems.  You have chest pain or shortness of breath.  These symptoms may be an emergency. Call 911 right away.  Do not wait to see if the symptoms will go away.  Do not drive yourself to the hospital.  This information is not intended to replace advice given to you by your health care provider. Make sure you discuss any questions you have with your health care provider.  Document Revised: 08/10/2023 Document Reviewed: 08/10/2023  Elsevier Patient Education ?? [employer].

## 2024-03-21 NOTE — Progress Notes (Signed)
 "   Routine Prenatal Care Visit  Subjective  Sarah Martinez is a 24 y.o. [phone] at [redacted]w[redacted]d being seen today for ongoing prenatal care.  She is currently monitored for the following issues for this high-risk pregnancy and has History of preterm delivery, currently pregnant; Herpes infection; Obesity in pregnancy; Transfusion of blood product refused for religious reason; Supervision of high risk pregnancy, antepartum; Dysuria during pregnancy in third trimester; and Upper respiratory infection on their problem list.  ----------------------------------------------------------------------------------- Patient reports a recent URI and since coughing she is having right flank pain. Will Rx Flexeril . Other comfort measures reviewed.   Contractions: Not present. Vag. Bleeding: None.  Movement: Present. Leaking Fluid denies.  ----------------------------------------------------------------------------------- The following portions of the patient's history were reviewed and updated as appropriate: allergies, current medications, past family history, past medical history, past social history, past surgical history and problem list. Problem list updated.  Objective  BP 108/65   Pulse (!) 116   Wt 211 lb 11.2 oz (96 kg)   LMP 07/04/2023   BMI 40.00 kg/m  Pregravid weight 197 lb (89.4 kg) Total Weight Gain 14 lb 11.2 oz (6.668 kg) Urinalysis: Urine Protein    Urine Glucose    Fetal Status: Fetal Heart Rate (bpm): 152   Movement: Present      General:  Alert, oriented and cooperative. Patient is in no acute distress.  Skin: Skin is warm and dry. No rash noted.   Cardiovascular: Normal heart rate noted  Respiratory: Normal respiratory effort, no problems with respiration noted  Abdomen: Soft, gravid, appropriate for gestational age. Pain/Pressure: Absent     Pelvic:  Cervical exam deferred        Extremities: Normal range of motion.  Edema: None  Mental Status: Normal mood and affect.  Normal behavior. Normal judgment and thought content.   Assessment   24 y.o. H5E7896 at [redacted]w[redacted]d by  04/09/2024, by Last Menstrual Period presenting for routine prenatal visit  Plan   FOURTH Problems (from 10/12/23 to present)     Problem Noted Diagnosed Resolved   Headache 12/21/2023 by Delinda Jinnie Jansky, CNM  No   Supervision of high risk pregnancy, antepartum 10/12/2023 by Loralyn Sander, CMA  No   Overview Addendum 03/21/2024  2:10 PM by Taft Salines, LPN   Clinical Staff Provider  Office Location  [facility] Dating  04/09/2024, Date entered prior to episode creation  Language  English Anatomy US   Complete; Anterior placenta  Flu Vaccine  declined Genetic Screen  NIPS: Neg; Female  TDaP vaccine   declined Hgb A1C or  GTT Early : 5.2 Third trimester : 136  Covid Declined   LAB RESULTS   Rhogam  O/Positive/-- (09/02 1502)  Blood Type O/Positive/-- (09/02 1502)   RSV Declined Antibody Negative (09/02 1502)  Feeding Plan Breast/Bottle Rubella <0.90 (09/02 1502)  Contraception BC Pills RPR Non Reactive (11/25 1013)   Circumcision NA HBsAg Negative (09/02 1502)   Pediatrician  Carlin Blamer HIV Non Reactive (11/25 1013)  Support Person [facility] Varicella Non Reactive (09/02 1502)  Prenatal Classes No GBS Negative/-- (01/23 1638)(For PCN allergy, check sensitivities)     Hep C Non Reactive (09/02 1502)   BTL Consent NA Pap No results found for: DIAGPAP  VBAC Consent NA Hgb Electro      CF      SMA                    Term labor symptoms and general obstetric  precautions including but not limited to vaginal bleeding, contractions, leaking of fluid and fetal movement were reviewed in detail with the patient. Please refer to After Visit Summary for other counseling recommendations.   Return in about 1 week (around 03/28/2024) for ROB.  Slater Rains, CNM 03/21/2024 2:48 PM    "

## 2024-03-26 NOTE — Patient Instructions (Signed)
 Third Trimester of Pregnancy: What to Know  The third trimester of pregnancy is from week 28 through week 40. This is months 7 through 9. The third trimester is a time when your baby is growing fast. Body changes during your third trimester Your body continues to change during this time. The changes usually go away after your baby is born. Physical changes You will continue to gain weight. You may get stretch marks on your hips, belly, and breasts. Your breasts will keep growing and may hurt. A yellow fluid (colostrum) may leak from your breasts. This is the first milk you're making for your baby. Your hair may grow faster and get thicker. In some cases, you may get hair loss. Your belly button may stick out. You may have more swelling in your hands, face, or ankles. Health changes You may have heartburn. You may feel short of breath. This is caused by the uterus that is now bigger. You may have more aches in the pelvis, back, or thighs. You may have more tingling or numbness in your hands, arms, and legs. You may pee more often. You may have trouble pooping (constipation) or swollen veins in the butt that can itch or get painful (hemorrhoids). Other changes You may have more problems sleeping. You may notice the baby moving lower in your belly (dropping). You may have more fluid coming from your vagina. Your joints may feel loose, and you may have pain around your pelvic bone. Follow these instructions at home: Medicines Take medicines only as told by your health care provider. Some medicines are not safe during pregnancy. Your provider may change the medicines that you take. Do not take any medicines unless told to by your provider. Take a prenatal vitamin that has at least 600 micrograms (mcg) of folic acid. Do not use herbal medicines, illegal drugs, or medicines that are not approved by your provider. Eating and drinking While you're pregnant your body needs additional nutrition  to help support your growing baby. Talk with your provider about your nutritional needs. Activity Most women are able to exercise regularly during pregnancy. Exercise routines may need to change at the end of your pregnancy. Talk to your provider about your activities and exercise routine. Relieving pain and discomfort Rest often with your legs raised if you have leg cramps or low back pain. Take warm sitz baths to soothe pain from hemorrhoids. Use hemorrhoid cream if your provider says it's okay. Wear a good, supportive bra if your breasts hurt. Do not use hot tubs, steam rooms, or saunas. Do not douche. Do not use tampons or scented pads. Safety Talk to your provider before traveling far distances. Wear your seatbelt at all times when you're in a car. Talk to your provider if someone hits you, hurts you, or yells at you. Preparing for birth To prepare for your baby: Take childbirth and breastfeeding classes. Visit the hospital and tour the maternity area. Buy a rear-facing car seat. Learn how to install it in your car. General instructions Avoid cat litter boxes and soil used by cats. These things carry germs that can cause harm to your pregnancy and your baby. Do not drink alcohol, smoke, vape, or use products with nicotine  or tobacco in them. If you need help quitting, talk with your provider. Keep all follow-up visits for your third trimester. Your provider will do more exams and tests during this trimester. Write down your questions. Take them to your prenatal visits. Your provider also will: Talk  with you about your overall health. Give you advice or refer you to specialists who can help with different needs, including: Mental health and counseling. Foods and healthy eating. Ask for help if you need help with food. Where to find more information American Pregnancy Association: americanpregnancy.org [employer]: acog.org Office on [employer]  Health: [place] Contact a health care provider if: You have a headache that does not go away when you take medicine. You have any of these problems: You can't eat or drink. You have nausea and vomiting. You have watery poop (diarrhea) for 2 days or more. You have pain when you pee, or your pee smells bad. You have been sick for 2 days or more and aren't getting better. Contact your provider right away if: You have any of these coming from your vagina: Abnormal discharge. Bad-smelling fluid. Bleeding. Your baby is moving less than usual. You have signs of labor: You have any contractions, belly cramping, or have pain in your pelvis or lower back before 37 weeks of pregnancy (preterm labor). You have regular contractions that are less than 5 minutes apart. Your water breaks. You have symptoms of high blood pressure or preeclampsia. These include: A severe, throbbing headache that does not go away. Sudden or extreme swelling of your face, hands, legs, or feet. Vision problems: You see spots. You have blurry vision. Your eyes are sensitive to light. If you can't reach your provider, go to an urgent care or emergency room. Get help right away if: You faint, become confused, or can't think clearly. You have chest pain or trouble breathing. You have any kind of injury, such as from a fall or a car crash. These symptoms may be an emergency. Call 911 right away. Do not wait to see if the symptoms will go away. Do not drive yourself to the hospital. This information is not intended to replace advice given to you by your health care provider. Make sure you discuss any questions you have with your health care provider. Document Revised: 12/20/2023 Document Reviewed: 06/09/2022 Elsevier Patient Education  2025 [employer]. Signs and Symptoms of Labor: Self-Care Labor is your body's natural process of moving your baby and the placenta out of your uterus. The process of labor  usually starts when the baby is full-term, between 18 and 41 weeks of pregnancy. Signs and symptoms that you're close to going into labor As your body gets ready for labor and the birth of your baby, you may notice some symptoms in the weeks and days before true labor starts. These may include: A feeling of more pressure on your bladder and pelvic bone, but less pressure on your ribs. This is your baby moving lower in your pelvis to get into position for birth. This change may make it easier to breathe. It may also make you need to pee more often or have trouble pooping. Passing a small amount of thick mucus from your vagina. This is your mucus plug. This may happen more than a week before labor begins or right before labor begins, as the cervix, which is the lowest part of the uterus, starts to open or dilate. For some people, the entire mucus plug passes at once. For others, pieces of the mucus plug may slowly pass over a few days. The mucus plug may be clear, pink, or blood-tinged. This is normal. Having Braxton Hicks contractions. These are also called practice contractions. They may feel like: Tightening or pressure in your belly.  Mild cramps, similar to menstrual cramps. These contractions: Are common after exercise or sex. Are usually mild and don't get stronger or closer together over time. Are more than 10 minutes apart. Happen at different times and don't follow a regular pattern. Will stop if you change position, rest, or drink fluids. Other early symptoms of labor include: Having a sudden burst of energy or a desire to do things such as clean or go shopping. This is called nesting. Loss of appetite or feeling like you may throw up. Diarrhea, which is watery poop. Mood changes. Having trouble sleeping. Signs and symptoms that labor has begun Signs that you're in labor may include: Regular and stronger contractions. Contractions come at regular intervals and get stronger over  time. They may feel like tightening or pressure starting in your back that moves to your belly. They may also feel like rhythmic pain in your upper thighs or back. If you're giving birth for the first time, contractions often take a long time to get stronger. If you've given birth before, contractions may get stronger very quickly. Vaginal pressure. A feeling of pressure in the vaginal area is common as labor progresses. Water breaking. The sac of fluid that surrounds the baby breaks, and the fluid leaks from the vagina. The fluid may be clear or blood-tinged. You may feel a sudden gush of fluid or may notice that you have damp underwear again and again. Labor often starts within 24 hours of your water breaking, but it may take longer. Cervical changes. Your cervix dilates as labor progresses. Your health care provider can check to see these changes. Follow these instructions at home:  When labor starts, or if your water breaks, call your provider or nurse care line. They will tell you when you should go in for an exam. During early labor, you may be able to rest and manage symptoms at home. Some things to try at home include: Breathing and relaxation techniques. Taking a warm bath or shower. Listening to music. Using heat as told. Use the heat source that your provider recommends, such as a moist heat pack or a heating pad. Do this as often as told. Place a towel between your skin and the heat source. Leave the heat on for 20-30 minutes. If your skin turns red, take off the heat right away to prevent burns. The risk of burns is higher if you can't feel pain, heat, or cold. Where to find more information To learn more: Go to [place]. Type signs of labor in the search box. Find the link you need. Contact a health care provider if: You have a fever. Your contractions are strong and regular. You have lower back pain or cramping. Your water breaks. Get help right away  if: You have painful, regular contractions that are 5 minutes apart or less. Labor starts before you're [redacted] weeks along in your pregnancy. You have severe pain in your upper belly. You have bright red blood coming from your vagina. You don't feel your baby moving like normal. You have a very bad headache with or without vision problems. You have chest pain or shortness of breath. These symptoms may be an emergency. Call 911 right away. Do not wait to see if the symptoms will go away. Do not drive yourself to the hospital. This information is not intended to replace advice given to you by your health care provider. Make sure you discuss any questions you have with your health care provider. Document Revised: 08/10/2023  Document Reviewed: 08/10/2023 Elsevier Patient Education  [employer].

## 2024-03-27 ENCOUNTER — Ambulatory Visit: Admitting: Registered Nurse

## 2024-03-27 DIAGNOSIS — O0990 Supervision of high risk pregnancy, unspecified, unspecified trimester: Secondary | ICD-10-CM

## 2024-03-27 DIAGNOSIS — Z3A38 38 weeks gestation of pregnancy: Secondary | ICD-10-CM | POA: Insufficient documentation

## 2024-03-27 NOTE — Progress Notes (Deleted)
" ° ° °  Return Prenatal Note   Subjective   24 y.o. H5E7896 at [redacted]w[redacted]d presents for this follow-up prenatal visit.  Patient *** Patient reports:    Objective   Flow sheet Vitals:   Total weight gain: 14 lb 11.2 oz (6.668 kg)  General Appearance  No acute distress, well appearing, and well nourished Pulmonary   Normal work of breathing Neurologic   Alert and oriented to person, place, and time Psychiatric   Mood and affect within normal limits   Assessment/Plan   Plan  23 y.o. H5E7896 at [redacted]w[redacted]d presents for follow-up OB visit. Reviewed prenatal record including previous visit note.  Supervision of high risk pregnancy, antepartum RTC weekly.      No orders of the defined types were placed in this encounter.  No follow-ups on file.   Future Appointments  Date Time Provider Department Center  03/27/2024  1:55 PM Charma Domino, CNM [facility] None  04/04/2024  2:35 PM Slaughterbeck, Damien, CNM [facility] None    For next visit:  continue with routine prenatal care     Domino Charma, CNM  03/27/24 1:03 PM  "

## 2024-03-27 NOTE — Assessment & Plan Note (Signed)
RTC weekly

## 2024-04-04 ENCOUNTER — Encounter: Admitting: Certified Nurse Midwife
